# Patient Record
Sex: Male | Born: 1960
Health system: Southern US, Community
[De-identification: ages and names within clinical notes are randomized; demographics above are authoritative.]

## PROBLEM LIST (undated history)

## (undated) DIAGNOSIS — R569 Unspecified convulsions: Secondary | ICD-10-CM

## (undated) DIAGNOSIS — F79 Unspecified intellectual disabilities: Secondary | ICD-10-CM

---

## 2015-08-15 ENCOUNTER — Emergency Department (HOSPITAL_COMMUNITY)
Admission: EM | Admit: 2015-08-15 | Discharge: 2015-08-15 | Disposition: A | Discharge status: Home / Self Care | Payer: Medicare Other | Attending: Emergency Medicine | Admitting: Emergency Medicine

## 2015-08-15 ENCOUNTER — Encounter (HOSPITAL_COMMUNITY): Payer: Self-pay | Admitting: *Deleted

## 2015-08-15 DIAGNOSIS — Z8659 Personal history of other mental and behavioral disorders: Secondary | ICD-10-CM | POA: Diagnosis not present

## 2015-08-15 DIAGNOSIS — R569 Unspecified convulsions: Secondary | ICD-10-CM

## 2015-08-15 HISTORY — DX: Unspecified convulsions: R56.9

## 2015-08-15 HISTORY — DX: Unspecified intellectual disabilities: F79

## 2015-08-15 LAB — CBC WITH DIFFERENTIAL/PLATELET
BASOS ABS: 0 10*3/uL (ref 0.0–0.1)
Basophils Relative: 1 %
Eosinophils Absolute: 0.1 10*3/uL (ref 0.0–0.7)
Eosinophils Relative: 2 %
HCT: 41.9 % (ref 39.0–52.0)
HEMOGLOBIN: 13.8 g/dL (ref 13.0–17.0)
LYMPHS ABS: 1.3 10*3/uL (ref 0.7–4.0)
LYMPHS PCT: 27 %
MCH: 28.1 pg (ref 26.0–34.0)
MCHC: 32.9 g/dL (ref 30.0–36.0)
MCV: 85.3 fL (ref 78.0–100.0)
Monocytes Absolute: 0.1 10*3/uL (ref 0.1–1.0)
Monocytes Relative: 3 %
NEUTROS PCT: 67 %
Neutro Abs: 3.1 10*3/uL (ref 1.7–7.7)
Platelets: 180 10*3/uL (ref 150–400)
RBC: 4.91 MIL/uL (ref 4.22–5.81)
RDW: 12.5 % (ref 11.5–15.5)
WBC: 4.7 10*3/uL (ref 4.0–10.5)

## 2015-08-15 LAB — COMPREHENSIVE METABOLIC PANEL
ALT: 21 U/L (ref 17–63)
ANION GAP: 11 (ref 5–15)
AST: 25 U/L (ref 15–41)
Albumin: 4.2 g/dL (ref 3.5–5.0)
Alkaline Phosphatase: 50 U/L (ref 38–126)
BUN: 16 mg/dL (ref 6–20)
CHLORIDE: 102 mmol/L (ref 101–111)
CO2: 24 mmol/L (ref 22–32)
Calcium: 9.2 mg/dL (ref 8.9–10.3)
Creatinine, Ser: 0.91 mg/dL (ref 0.61–1.24)
Glucose, Bld: 87 mg/dL (ref 65–99)
POTASSIUM: 4 mmol/L (ref 3.5–5.1)
Sodium: 137 mmol/L (ref 135–145)
TOTAL PROTEIN: 7 g/dL (ref 6.5–8.1)
Total Bilirubin: 1 mg/dL (ref 0.3–1.2)

## 2015-08-15 LAB — RAPID URINE DRUG SCREEN, HOSP PERFORMED
AMPHETAMINES: NOT DETECTED
BARBITURATES: NOT DETECTED
BENZODIAZEPINES: NOT DETECTED
COCAINE: NOT DETECTED
Opiates: NOT DETECTED
TETRAHYDROCANNABINOL: NOT DETECTED

## 2015-08-15 LAB — URINALYSIS, ROUTINE W REFLEX MICROSCOPIC
BILIRUBIN URINE: NEGATIVE
Glucose, UA: NEGATIVE mg/dL
Ketones, ur: 15 mg/dL — AB
LEUKOCYTES UA: NEGATIVE
NITRITE: NEGATIVE
PH: 5.5 (ref 5.0–8.0)
Protein, ur: 100 mg/dL — AB
SPECIFIC GRAVITY, URINE: 1.025 (ref 1.005–1.030)

## 2015-08-15 LAB — URINE MICROSCOPIC-ADD ON

## 2015-08-15 MED ORDER — PHENYTOIN SODIUM EXTENDED 200 MG PO CAPS: 200.0000 mg | ORAL_CAPSULE | Freq: Two times a day (BID) | ORAL | Status: DC

## 2015-08-15 MED ORDER — PHENYTOIN SODIUM EXTENDED 100 MG PO CAPS
100.0000 mg | ORAL_CAPSULE | Freq: Once | ORAL | Status: AC
  Administered 2015-08-15: 100 mg via ORAL
  Filled 2015-08-15: qty 1

## 2015-08-15 NOTE — ED Provider Notes (Signed)
CSN: [id number]     Arrival date & time 08/15/15  1233 History   First MD Initiated Contact with Patient 08/15/15 1315     Chief Complaint  Patient presents with  . Seizures     (Consider location/radiation/quality/duration/timing/severity/associated sxs/prior Treatment) HPI   Charles Rich is a 55 y.o. male, with a history of seizures, presenting to the ED with reported seizure that occurred just prior to arrival. Patient is accompanied at the bedside by his family. Family provides a large part of the history. Family states that the patient was lying in bed, when he began to have a full body seizure that lasted 3-4 minutes. Patient was incontinent of urine and was postictal for about 15-20 minutes. Family adds that the patient was previously on Dilantin 200 mg twice a day, but was taken off of it by Dr. Bruna Potter about a year ago due to lack of seizure activity. About a month ago, patient began having seizures again. Patient has had about 3 seizures in the last 30 days. Patient denies any pain, falls, trauma, recent illness, fever/chills, nausea/vomiting, or any other complaints.    Past Medical History  Diagnosis Date  . Seizures (HCC)   . Mental retardation    No past surgical history on file. No family history on file. Social History  Substance Use Topics  . Smoking status: None  . Smokeless tobacco: None  . Alcohol Use: None    Review of Systems  Constitutional: Negative for fever and chills.  Gastrointestinal: Negative for nausea and vomiting.  Musculoskeletal: Negative for back pain and neck pain.  Neurological: Positive for seizures.  All other systems reviewed and are negative.     Allergies  Review of patient's allergies indicates no known allergies.  Home Medications   Prior to Admission medications   Medication Sig Start Date End Date Taking? Authorizing Provider  lactase (LACTAID) 3000 units tablet Take 3,000 Units by mouth daily as needed (dairy  products).   Yes Historical Provider, MD  phenytoin (DILANTIN) 200 MG ER capsule Take 1 capsule (200 mg total) by mouth 2 (two) times daily. 08/15/15   Shawn C Joy, PA-C   BP 115/77 mmHg  Pulse 80  Temp(Src) 97.5 F (36.4 C) (Oral)  Resp 16  SpO2 97% Physical Exam  Constitutional: He appears well-developed and well-nourished. No distress.  HENT:  Head: Normocephalic and atraumatic.  Eyes: Conjunctivae and EOM are normal. Pupils are equal, round, and reactive to light.  Neck: Normal range of motion. Neck supple.  Cardiovascular: Normal rate, regular rhythm, normal heart sounds and intact distal pulses.   Pulmonary/Chest: Effort normal and breath sounds normal. No respiratory distress.  Abdominal: Soft. There is no tenderness. There is no guarding.  Musculoskeletal: He exhibits no edema or tenderness.  Full ROM in all extremities and spine. No paraspinal tenderness.   Lymphadenopathy:    He has no cervical adenopathy.  Neurological: He is alert. He has normal reflexes.  Patient is oriented to his normal level, per family. This includes being able to answer with his name, day of the week, and the hospital.  Skin: Skin is warm and dry. He is not diaphoretic.  Psychiatric: He has a normal mood and affect. His behavior is normal.  Nursing note and vitals reviewed.   ED Course  Procedures (including critical care time) Labs Review Labs Reviewed  URINALYSIS, ROUTINE W REFLEX MICROSCOPIC (NOT AT [facility]) - Abnormal; Notable for the following:    Hgb urine dipstick LARGE (*)  Ketones, ur 15 (*)    Protein, ur 100 (*)    All other components within normal limits  URINE MICROSCOPIC-ADD ON - Abnormal; Notable for the following:    Squamous Epithelial / LPF 0-5 (*)    Bacteria, UA MANY (*)    All other components within normal limits  URINE CULTURE  URINE RAPID DRUG SCREEN, HOSP PERFORMED  COMPREHENSIVE METABOLIC PANEL  CBC WITH DIFFERENTIAL/PLATELET    Imaging Review No results  found. I have personally reviewed and evaluated these lab results as part of my medical decision-making.   EKG Interpretation None      MDM   Final diagnoses:  Seizure [facility](HCC)    Harvie BridgeJoseph Anthony Saxby presents with a seizure that occurred just prior to arrival.  Patient has a history of seizures and I suspect will just need to be put back on his seizure medication. Patient has an appointment for reevaluation by a P[facility] on May 19 at 9 AM. Patient had no abnormalities on his physical exam. The [facility] was contacted to see if there was an earlier appointment available, however, the May 19 appointment is the first one that would work with the patient's family's schedule. Patient was placed back on his Dilantin. UA shows possible UTI, however patient denies any urinary discomfort.  Urine was sent for culture. Discussed discharge and return precautions with the patient and his family. All parties voiced understanding of the instructions given, agree with the plan, and are comfortable with discharge.   Filed Vitals:   08/15/15 1430 08/15/15 1445 08/15/15 1454 08/15/15 1500  BP: 115/77 122/83 122/83 115/77  Pulse: 77 90 84 80  Temp:      TempSrc:      Resp: 15 13 16 16   SpO2: 98% 100% 100% 97%       Anselm PancoastShawn C Joy, PA-C 08/15/15 1524  Gerhard Munchobert Lockwood, MD 08/16/15 1557

## 2015-08-15 NOTE — ED Notes (Signed)
Per pt. Family, pt. Ran out of sz meds 30 days ago not 1 year ago. Family states pt. PCP passed away and has not found new dr to prescribe meds.

## 2015-08-15 NOTE — ED Notes (Signed)
[facility] and report is patient had a witnessed seizure that lasted 3 minutes and it was generalized per family.  Pt has history of seizures and was taken off Dilantin 1 year ago and since has had per family 3-4 focal like seizures.  Pt is his normal baseline where he knows his name, day of the week and hospital.

## 2015-08-15 NOTE — Discharge Instructions (Signed)
You have been seen today for seizures. Your lab tests showed no abnormalities. [facility] your [facility]. Begin taking your Dilantin tomorrow morning. You have been given your dose for this evening. Return to ED should seizures recur.

## 2015-08-15 NOTE — ED Notes (Signed)
Pt family reports that he has been off his seizure medication for 5 months. Pt has been taking this medication since birth but it was stopped by his PCP.

## 2015-08-16 LAB — URINE CULTURE

## 2015-09-01 ENCOUNTER — Ambulatory Visit: Payer: Self-pay | Admitting: Family Medicine

## 2015-09-08 ENCOUNTER — Ambulatory Visit: Payer: Self-pay | Admitting: Family Medicine

## 2015-11-02 ENCOUNTER — Ambulatory Visit (INDEPENDENT_AMBULATORY_CARE_PROVIDER_SITE_OTHER): Payer: Medicare Other | Admitting: Family Medicine

## 2015-11-02 ENCOUNTER — Encounter: Payer: Self-pay | Admitting: Family Medicine

## 2015-11-02 VITALS — BP 112/73 | HR 88 | Temp 98.4°F | Resp 16

## 2015-11-02 DIAGNOSIS — G40909 Epilepsy, unspecified, not intractable, without status epilepticus: Secondary | ICD-10-CM

## 2015-11-02 DIAGNOSIS — R531 Weakness: Secondary | ICD-10-CM

## 2015-11-02 DIAGNOSIS — Z13220 Encounter for screening for lipoid disorders: Secondary | ICD-10-CM

## 2015-11-02 LAB — CBC WITH DIFFERENTIAL/PLATELET
BASOS PCT: 1 %
Basophils Absolute: 51 cells/uL (ref 0–200)
EOS ABS: 153 {cells}/uL (ref 15–500)
Eosinophils Relative: 3 %
HEMATOCRIT: 38.3 % — AB (ref 38.5–50.0)
HEMOGLOBIN: 12.9 g/dL — AB (ref 13.2–17.1)
LYMPHS ABS: 1683 {cells}/uL (ref 850–3900)
LYMPHS PCT: 33 %
MCH: 28.5 pg (ref 27.0–33.0)
MCHC: 33.7 g/dL (ref 32.0–36.0)
MCV: 84.5 fL (ref 80.0–100.0)
MONO ABS: 306 {cells}/uL (ref 200–950)
MPV: 9.7 fL (ref 7.5–12.5)
Monocytes Relative: 6 %
Neutro Abs: 2907 cells/uL (ref 1500–7800)
Neutrophils Relative %: 57 %
Platelets: 171 10*3/uL (ref 140–400)
RBC: 4.53 MIL/uL (ref 4.20–5.80)
RDW: 13.3 % (ref 11.0–15.0)
WBC: 5.1 10*3/uL (ref 3.8–10.8)

## 2015-11-02 MED ORDER — PHENYTOIN SODIUM EXTENDED 200 MG PO CAPS: 200.0000 mg | ORAL_CAPSULE | Freq: Two times a day (BID) | ORAL | Status: DC

## 2015-11-02 NOTE — Progress Notes (Signed)
Patient ID: Charles Rich, male   DOB: [birth year], 55 y.o.   MRN: [id number]   Charles Rich, is a 55 y.o. male  WUJ:[id number]  [id number]  DOB - [birth year]  CC:  Chief Complaint  Patient presents with  . Establish Care  . Medication Refill  . Seizures       HPI: Charles Rich is a 55 y.o. male here to establish care. He is accompanied by family member and care giver. He has a history of mental retardation and most information is provided by family. He had a history of seizure disorder. His Dilantin was discontinued approximately a year ago due to no recent seizure activity. He has had several seizures over the last few weeks and was recently seen in ED and restarted on Dilantin 200 daily on 4-25. He has had no seizure activity since restarting. He was instructed to follow-up here for ongoing care. His only other medication is Lactaid. Family reports he is unable to walk and he ambulated by crawling. He is in a wheelchair. Home PT is requested to help with strength building in his legs.  He does need a refill on Dilantin.  No Known Allergies Past Medical History  Diagnosis Date  . Seizures (HCC)   . Mental retardation    Current Outpatient Prescriptions on File Prior to Visit  Medication Sig Dispense Refill  . lactase (LACTAID) 3000 units tablet Take 3,000 Units by mouth daily as needed (dairy products).     No current facility-administered medications on file prior to visit.   Family History  Problem Relation Age of Onset  . Diabetes Mother    Social History   Social History  . Marital Status: Single    Spouse Name: N/A  . Number of Children: N/A  . Years of Education: N/A   Occupational History  . Not on file.   Social History Main Topics  . Smoking status: Never Smoker   . Smokeless tobacco: Not on file  . Alcohol Use: No  . Drug Use: No  . Sexual Activity: Not on file   Other Topics Concern  . Not on file   Social History Narrative    Review  of Systems: Constitutional: Negative for fever, chills, appetite change, weight loss,  Fatigue. Skin: Negative for rashes or lesions of concern. HENT: Negative for ear pain, ear discharge.nose bleeds Eyes: Negative for pain, discharge, redness, itching and visual disturbance. Neck: Negative for pain, stiffness Respiratory: Negative for cough, shortness of breath,   Cardiovascular: Negative for chest pain, palpitations and leg swelling. Gastrointestinal: Negative for abdominal pain, nausea, vomiting, constipations. Positive for diarrhea Genitourinary: Negative for dysuria, urgency, frequency, hematuria,  Musculoskeletal: Negative for back pain, joint pain, joint  swelling, and gait problem Positive for weakness of legs Neurological: Negative for dizziness, tremors, syncope,   light-headedness, numbness and headaches. Positive for seizure disorder Hematological: Negative for easy bruising or bleeding Psychiatric/Behavioral: Negative for depression, anxiety, decreased concentration, confusion   Objective:   Filed Vitals:   11/02/15 0924  BP: 112/73  Pulse: 88  Temp: 98.4 F (36.9 C)  Resp: 16    Physical Exam: Constitutional: Patient is very thin. Appears in no distress. HENT: Normocephalic, atraumatic, External right and left ear normal. Oropharynx is clear and moist.  Eyes: Conjunctivae and EOM are normal. PERRLA, no scleral icterus. Neck: Normal ROM. Neck supple. No lymphadenopathy, No thyromegaly. CVS: RRR, S1/S2 +, no murmurs, no gallops, no rubs Pulmonary: Effort and breath sounds normal, no stridor, rhonchi, wheezes,  rales.  Abdominal: Soft. Normoactive BS,, no distension, tenderness, rebound or guarding.  Musculoskeletal: Normal range of motion. No edema and no tenderness.  Further exam deferred will have assessed by home PT. Neuro: Alert.Normal muscle tone coordination. Non-focal Skin: Skin is warm and dry. No rash noted. Not diaphoretic. No erythema. No  pallor. Psychiatric: Family reports he is in his usual mental and emotional state.  Lab Results  Component Value Date   WBC 4.7 08/15/2015   HGB 13.8 08/15/2015   HCT 41.9 08/15/2015   MCV 85.3 08/15/2015   PLT 180 08/15/2015   Lab Results  Component Value Date   CREATININE 0.91 08/15/2015   BUN 16 08/15/2015   NA 137 08/15/2015   K 4.0 08/15/2015   CL 102 08/15/2015   CO2 24 08/15/2015    No results found for: HGBA1C Lipid Panel  No results found for: CHOL, TRIG, HDL, CHOLHDL, VLDL, LDLCALC     Assessment and plan:   1. Seizure disorder (HCC)  - Phenytoin level, free and total - COMPLETE METABOLIC PANEL WITH GFR - CBC with Differential - phenytoin (DILANTIN) 200 MG ER capsule; Take 1 capsule (200 mg total) by mouth 2 (two) times daily.  Dispense: 60 capsule; Refill: 2  2. Screening for cholesterol level    - Lipid panel   No Follow-up on file.  The patient was given clear instructions to go to ER or return to medical center if symptoms don't improve, worsen or new problems develop. The patient verbalized understanding.    Henrietta HooverLinda C Bernhardt FNP  11/02/2015, 11:52 AM

## 2015-11-03 LAB — COMPLETE METABOLIC PANEL WITH GFR
ALT: 17 U/L (ref 9–46)
AST: 22 U/L (ref 10–35)
Albumin: 4.4 g/dL (ref 3.6–5.1)
Alkaline Phosphatase: 64 U/L (ref 40–115)
BUN: 19 mg/dL (ref 7–25)
CALCIUM: 8.9 mg/dL (ref 8.6–10.3)
CHLORIDE: 101 mmol/L (ref 98–110)
CO2: 25 mmol/L (ref 20–31)
CREATININE: 0.82 mg/dL (ref 0.70–1.33)
GFR, Est Non African American: >89 mL/min (ref >=60)
Glucose, Bld: 89 mg/dL (ref 65–99)
POTASSIUM: 4.1 mmol/L (ref 3.5–5.3)
Sodium: 137 mmol/L (ref 135–146)
Total Bilirubin: 0.4 mg/dL (ref 0.2–1.2)
Total Protein: 6.6 g/dL (ref 6.1–8.1)

## 2015-11-03 LAB — LIPID PANEL
CHOL/HDL RATIO: 3.6 ratio (ref <=5.0)
Cholesterol: 204 mg/dL — ABNORMAL HIGH (ref 125–200)
HDL: 57 mg/dL (ref >=40)
LDL CALC: 134 mg/dL — AB (ref <130)
TRIGLYCERIDES: 64 mg/dL (ref <150)
VLDL: 13 mg/dL (ref <30)

## 2015-11-04 LAB — PHENYTOIN LEVEL, FREE AND TOTAL

## 2015-11-06 ENCOUNTER — Telehealth: Payer: Self-pay

## 2015-11-06 NOTE — Telephone Encounter (Signed)
-----   Message from Henrietta HooverLinda C Bernhardt, NP sent at 11/06/2015  7:50 AM EDT ----- Dilantin not at therapeutic level. Confirm that he is taking regularly.

## 2015-11-06 NOTE — Telephone Encounter (Signed)
Called, no answer. Left message to return call. Thanks!  

## 2015-11-07 ENCOUNTER — Other Ambulatory Visit: Payer: Self-pay | Admitting: Family Medicine

## 2015-12-28 ENCOUNTER — Telehealth: Payer: Self-pay

## 2015-12-28 MED ORDER — LACTASE 3000 UNITS PO TABS: 3000.0000 [IU] | ORAL_TABLET | Freq: Every day | ORAL | 3 refills | Status: DC | PRN

## 2015-12-28 NOTE — Telephone Encounter (Signed)
Refill sent into pharmacy. Thanks!  

## 2016-02-12 ENCOUNTER — Ambulatory Visit: Payer: Medicare Other | Admitting: Family Medicine

## 2017-03-25 ENCOUNTER — Ambulatory Visit: Payer: Medicare Other | Admitting: Family Medicine

## 2017-04-08 ENCOUNTER — Ambulatory Visit: Payer: Medicare Other | Admitting: Family Medicine

## 2017-04-29 ENCOUNTER — Ambulatory Visit: Payer: Medicare Other | Admitting: Family Medicine

## 2017-05-02 ENCOUNTER — Ambulatory Visit (INDEPENDENT_AMBULATORY_CARE_PROVIDER_SITE_OTHER): Payer: Medicare Other | Admitting: Family Medicine

## 2017-05-02 ENCOUNTER — Encounter: Payer: Self-pay | Admitting: Family Medicine

## 2017-05-02 VITALS — BP 124/88 | HR 84 | Temp 98.8°F | Ht 72.0 in

## 2017-05-02 DIAGNOSIS — Z23 Encounter for immunization: Secondary | ICD-10-CM

## 2017-05-02 DIAGNOSIS — R569 Unspecified convulsions: Secondary | ICD-10-CM | POA: Diagnosis not present

## 2017-05-02 DIAGNOSIS — Z1329 Encounter for screening for other suspected endocrine disorder: Secondary | ICD-10-CM | POA: Diagnosis not present

## 2017-05-02 DIAGNOSIS — Z131 Encounter for screening for diabetes mellitus: Secondary | ICD-10-CM | POA: Diagnosis not present

## 2017-05-02 DIAGNOSIS — Z7409 Other reduced mobility: Secondary | ICD-10-CM

## 2017-05-02 DIAGNOSIS — F79 Unspecified intellectual disabilities: Secondary | ICD-10-CM | POA: Diagnosis not present

## 2017-05-02 LAB — POCT GLYCOSYLATED HEMOGLOBIN (HGB A1C): HEMOGLOBIN A1C: 6

## 2017-05-02 MED ORDER — GABAPENTIN 300 MG PO CAPS: 300.0000 mg | ORAL_CAPSULE | Freq: Three times a day (TID) | ORAL | 1 refills | Status: DC

## 2017-05-02 NOTE — Patient Instructions (Addendum)
I am referring you to neurology. [facility] will contact you to schedule appointment.  For now, I would like you to start Gabapentin 300 mg, 3 times daily for seizure prevention.   I will place [facility] in home-attendant.   Seizure, Adult When you have a seizure:  Parts of your body may move.  How aware or awake (conscious) you are may change.  You may shake (convulse).  Some people have symptoms right before a seizure happens. These symptoms may include:  Fear.  Worry (anxiety).  Feeling like you are going to throw up (nausea).  Feeling like the room is spinning (vertigo).  Feeling like you saw or heard something before (deja vu).  Odd tastes or smells.  Changes in vision, such as seeing flashing lights or spots.  Seizures usually last from 30 seconds to 2 minutes. Usually, they are not harmful unless they last a long time. Follow these instructions at home: Medicines  Take over-the-counter and prescription medicines only as told by your doctor.  Avoid anything that may keep your medicine from working, such as alcohol. Activity  Do not do any activities that would be dangerous if you had another seizure, like driving or swimming. Wait until your doctor approves.  If you live in the [place] ask your local DMV (department of motor vehicles) when you can drive.  Rest. Teaching others  Teach friends and family what to do when you have a seizure. They should: ? Lay you on the ground. ? Protect your head and body. ? Loosen any tight clothing around your neck. ? Turn you on your side. ? Stay with you until you are better. ? Not hold you down. ? Not put anything in your mouth. ? Know whether or not you need emergency care. General instructions  Contact your doctor each time you have a seizure.  Avoid anything that gives you seizures.  Keep a seizure diary. Write down: ? What you think caused each seizure. ? What  you remember about each seizure.  Keep all follow-up visits as told by your doctor. This is important. Contact a doctor if:  You have another seizure.  You have seizures more often.  There is any change in what happens during your seizures.  You continue to have seizures with treatment.  You have symptoms of being sick or having an infection. Get help right away if:  You have a seizure: ? That lasts longer than 5 minutes. ? That is different than seizures you had before. ? That makes it harder to breathe. ? After you hurt your head.  After a seizure, you cannot speak or use a part of your body.  After a seizure, you are confused or have a bad headache.  You have two or more seizures in a row.  You are having seizures more often.  You do not wake up right after a seizure.  You get hurt during a seizure. In an emergency:  These symptoms may be an emergency. Do not wait to see if the symptoms will go away. Get medical help right away. Call your local emergency services (911 in the [place] Do not drive yourself to the hospital. This information is not intended to replace advice given to you by your health care provider. Make sure you discuss any questions you have with your health care provider. Document Released: 09/25/2007 Document Revised: 12/20/2015 Document Reviewed: 12/20/2015 Elsevier Interactive Patient Education  2017 [employer]

## 2017-05-02 NOTE — Progress Notes (Signed)
Patient ID: Charles Rich, male    DOB: 03/12/1961, 57 y.o.   MRN: [id number]  PCP: Bing NeighborsHarris, Kavion Mancinas S, FNP  Chief Complaint  Patient presents with  . Establish Care  . Medication Refill  . Referral    Home health    Subjective:  HPI Charles BridgeJoseph Anthony Cheema is a 57 y.o. male with history of seizure disorder, mental retardation, presents to establish care and referral requests. He is accompanied by caregiver/family member with very limited medical history related to patient. Caregiver initially asked for medication refills of Dilantin and Lactase as patient has been taking medications. According to Epic medication was last refilled 01/07/2016 with 30 days supply with 3 refills. Once presented with this information, the caregiver reported that he hasn't had antiseizure medication in greater than 1 year.Reports weekly seizure activity, with the last seizure occurring over 1 week ago in which he did not loose conscious. She reports to her knowledge initial seizure occurred in 2017. He has lived most of his life and received most of his healthcare from [place] although doesn't recall the healthcare facility in which he received care or all of the conditions in which he suffers from. He is immobile, although walked prior to 2006. Uncertain of CVA history. Incontinent of stool and urine. He responds "yes" to all questions. He has not been to the emergency department or hospitalized for at least the last two years. Unknown prior 2 years.Caregiver is requesting a home health attendant to assist with bathing, dressing, and preparing meals for patient.  Social History   Socioeconomic History  . Marital status: Single    Spouse name: Not on file  . Number of children: Not on file  . Years of education: Not on file  . Highest education level: Not on file  Social Needs  . Financial resource strain: Not on file  . Food insecurity - worry: Not on file  . Food insecurity - inability: Not on file  .  Transportation needs - medical: Not on file  . Transportation needs - non-medical: Not on file  Occupational History  . Not on file  Tobacco Use  . Smoking status: Never Smoker  . Smokeless tobacco: Never Used  Substance and Sexual Activity  . Alcohol use: No  . Drug use: No  . Sexual activity: Not on file  Other Topics Concern  . Not on file  Social History Narrative  . Not on file    Family History  Problem Relation Age of Onset  . Diabetes Mother      Review of Systems  There are no active problems to display for this patient.   No Known Allergies  Prior to Admission medications   Medication Sig Start Date End Date Taking? Authorizing Provider  lactase (LACTAID) 3000 units tablet Take 1 tablet (3,000 Units total) by mouth daily as needed (dairy products). 12/28/15  Yes Henrietta HooverBernhardt, Linda C, NP  phenytoin (DILANTIN) 200 MG ER capsule Take 1 capsule (200 mg total) by mouth 2 (two) times daily. 11/02/15  Yes Henrietta HooverBernhardt, Linda C, NP    Past Medical, Surgical Family and Social History reviewed and updated.    Objective:   Today's Vitals   05/02/17 1420  BP: 124/88  Pulse: 84  Temp: 98.8 F (37.1 C)  TempSrc: Oral  SpO2: 100%  Height: 6' (1.829 m)    Wt Readings from Last 3 Encounters:  No data found for [employer]    Physical Exam  Constitutional: He is cooperative.  HENT:  Head: Normocephalic.  Mouth/Throat: Oropharynx is clear and moist.  Eyes: Conjunctivae are normal. Pupils are equal, round, and reactive to light.  Neck: Normal range of motion. Neck supple.  Cardiovascular: Normal rate, regular rhythm, normal heart sounds and intact distal pulses.  Pulmonary/Chest: Effort normal and breath sounds normal.  Abdominal: Soft. Bowel sounds are normal.  Neurological: He is alert.  Oriented to self  Followed most commands  Skin: Skin is warm and dry.  Psychiatric: His behavior is normal. Cognition and memory are impaired.  Happy, pleasant, mood limited  vocabulary  although words intelligible     Assessment & Plan:  1. Seizures (HCC), unspecified severity. No prior medical records available.  According to the EMR patient has not had any previous EEGs or neurological evaluation.   he has been off of previously prescribed antiepileptic medication since the end of 2017.   Plan: Opted not to resume Dilantin as I am unaware of the patient's underlying condition and have very limited medical history of the patient.  I will place on gabapentin 300 mg 3 times daily to control reported seizure activity.  I am referring patient emergently to neurology for further evaluation and follow-up.  2. Immobility, unknown cause.  Duration of immobility has been somewhere around 14 years.  No known history of a stroke no known history of an injury.  Patient is unable to maintain balance. Plan-Placing an order for home physical therapy after patient is evaluated by neurology. Complete a referral for in home personal care attendant   3. Mental retardation, unknown cause or diagnosis  -neurology referral pending   4. Prediabetes - Hb A1C-6.0 Encouraged increase vegetables, lean meats, and limit breads/startch foods   5. Need for immunization against influenza- Flu Vaccine QUAD 36+ mos IM  6. Screening for thyroid disorder- Thyroid Panel With TSH   Meds ordered this encounter  Medications  . gabapentin (NEURONTIN) 300 MG capsule    Sig: Take 1 capsule (300 mg total) by mouth 3 (three) times daily.    Dispense:  90 capsule    Refill:  1    Order Specific Question:   Supervising Provider    Answer:   Quentin Angst [[id number]]    A total of 45 minutes spent, greater than 50 % of this time was spent reviewing prior medical history, reviewing medications and indications of treatment, prior labs and diagnostic tests, discussing current plan of treatment, health promotion, and goals of treatment.    RTC: 3 months, follow-up on referrals and wellness visit   Godfrey Pick. Tiburcio Pea, MSN, FNP-C [facility]  [address] [place], [place] [address] [phone]

## 2017-05-03 LAB — CBC WITH DIFFERENTIAL/PLATELET
BASOS ABS: 0 10*3/uL (ref 0.0–0.2)
Basos: 1 %
EOS (ABSOLUTE): 0.1 10*3/uL (ref 0.0–0.4)
Eos: 1 %
HEMOGLOBIN: 13.9 g/dL (ref 13.0–17.7)
Hematocrit: 42 % (ref 37.5–51.0)
Immature Grans (Abs): 0 10*3/uL (ref 0.0–0.1)
Immature Granulocytes: 0 %
LYMPHS ABS: 1.6 10*3/uL (ref 0.7–3.1)
Lymphs: 21 %
MCH: 28.3 pg (ref 26.6–33.0)
MCHC: 33.1 g/dL (ref 31.5–35.7)
MCV: 85 fL (ref 79–97)
MONOCYTES: 6 %
MONOS ABS: 0.4 10*3/uL (ref 0.1–0.9)
NEUTROS ABS: 5.4 10*3/uL (ref 1.4–7.0)
Neutrophils: 71 %
Platelets: 201 10*3/uL (ref 150–379)
RBC: 4.92 x10E6/uL (ref 4.14–5.80)
RDW: 12.9 % (ref 12.3–15.4)
WBC: 7.6 10*3/uL (ref 3.4–10.8)

## 2017-05-03 LAB — COMPREHENSIVE METABOLIC PANEL
A/G RATIO: 1.6 (ref 1.2–2.2)
ALBUMIN: 4.1 g/dL (ref 3.5–5.5)
ALT: 17 IU/L (ref 0–44)
AST: 16 IU/L (ref 0–40)
Alkaline Phosphatase: 52 IU/L (ref 39–117)
BILIRUBIN TOTAL: 0.3 mg/dL (ref 0.0–1.2)
BUN / CREAT RATIO: 18 (ref 9–20)
BUN: 13 mg/dL (ref 6–24)
CHLORIDE: 102 mmol/L (ref 96–106)
CO2: 24 mmol/L (ref 20–29)
Calcium: 9.3 mg/dL (ref 8.7–10.2)
Creatinine, Ser: 0.73 mg/dL — ABNORMAL LOW (ref 0.76–1.27)
GFR calc non Af Amer: 104 mL/min/{1.73_m2} (ref >59)
GFR, EST AFRICAN AMERICAN: 120 mL/min/{1.73_m2} (ref >59)
Globulin, Total: 2.6 g/dL (ref 1.5–4.5)
Glucose: 85 mg/dL (ref 65–99)
POTASSIUM: 4.1 mmol/L (ref 3.5–5.2)
SODIUM: 139 mmol/L (ref 134–144)
TOTAL PROTEIN: 6.7 g/dL (ref 6.0–8.5)

## 2017-05-03 LAB — THYROID PANEL WITH TSH
Free Thyroxine Index: 2.5 (ref 1.2–4.9)
T3 Uptake Ratio: 27 % (ref 24–39)
T4, Total: 9.3 ug/dL (ref 4.5–12.0)
TSH: 1.57 u[IU]/mL (ref 0.450–4.500)

## 2017-07-03 ENCOUNTER — Encounter: Payer: Self-pay | Admitting: Neurology

## 2017-07-03 ENCOUNTER — Telehealth: Payer: Self-pay | Admitting: Neurology

## 2017-07-03 ENCOUNTER — Ambulatory Visit (INDEPENDENT_AMBULATORY_CARE_PROVIDER_SITE_OTHER): Payer: Medicare Other | Admitting: Neurology

## 2017-07-03 ENCOUNTER — Encounter (INDEPENDENT_AMBULATORY_CARE_PROVIDER_SITE_OTHER): Payer: Self-pay

## 2017-07-03 VITALS — BP 154/93 | HR 103

## 2017-07-03 DIAGNOSIS — G40919 Epilepsy, unspecified, intractable, without status epilepticus: Secondary | ICD-10-CM | POA: Diagnosis not present

## 2017-07-03 MED ORDER — LEVETIRACETAM 500 MG PO TABS: 500.0000 mg | ORAL_TABLET | Freq: Two times a day (BID) | ORAL | 11 refills | Status: DC

## 2017-07-03 NOTE — Telephone Encounter (Signed)
[employer] Medicare/medicaid order sent to GI they will reach out to the patient to schedule.

## 2017-07-03 NOTE — Progress Notes (Signed)
PATIENT: Charles Rich DOB: 01/30/1961  Chief Complaint  Patient presents with  . Seizures    His nephew, Charles Rich, brought him to the appt today.  He is unsure of patient'Rich medical history.  The patient'Rich brother, Charles Rich, cares for him.  I called his brother at home and he confirmed that he is only taking gabapentin 300mg , one capsule TID and a daily multivitamin.  States the patient has not had a seizure in many years.  . New Patient (Initial Visit)    PCP: Charles CourtsKimberly Harris, NP     HISTORICAL  Charles Rich is a 57 year old male, is accompanied by his nephew Charles Rich and his niece Charles Rich for evaluation of seizure, seen in refer by his primary care nurse practitioner Charles Rich, initial evaluation was on July 03, 2017.  He has a long history of epilepsy, was born with mental retardation, used to live with his mother, since his mother passed away in 2006, he moved in with his brother since 2008, was previously under the care of his primary care physician  He used to take Dilantin, was recently put on gabapentin 300 mg 3 times a day since his recent visit with his primary care physician January 2019, niece reported that patient no longer ambulatory, has regular eating, bowel bladder regimen, is usually happy.  He has spells of sudden body flexion, especially with startle, this can happen many episode a day, about couple times each months, he would have a generalized seizure,   There was limited to history, he had a history of seizure disorder, mental retardation, according to epic, he presented to the emergency room in 2017 for recurrent seizure, there was no significant emotional outburst,  REVIEW OF SYSTEMS: Full 14 system review of systems performed and notable only for as above.  ALLERGIES: No Known Allergies  HOME MEDICATIONS: Current Outpatient Medications  Medication Sig Dispense Refill  . Multiple Vitamin  (MULTIVITAMIN) capsule Take 1 capsule by mouth daily.    Marland Kitchen. gabapentin (NEURONTIN) 300 MG capsule Take 1 capsule (300 mg total) by mouth 3 (three) times daily. 90 capsule 1   No current facility-administered medications for this visit.     PAST MEDICAL HISTORY: Past Medical History:  Diagnosis Date  . Mental retardation   . Seizures (HCC)     PAST SURGICAL HISTORY: History reviewed. No pertinent surgical history.  FAMILY HISTORY: Family History  Problem Relation Age of Onset  . Diabetes Mother     SOCIAL HISTORY:  Social History   Socioeconomic History  . Marital status: Single    Spouse name: Not on file  . Number of children: Not on file  . Years of education: Not on file  . Highest education level: Not on file  Social Needs  . Financial resource strain: Not on file  . Food insecurity - worry: Not on file  . Food insecurity - inability: Not on file  . Transportation needs - medical: Not on file  . Transportation needs - non-medical: Not on file  Occupational History  . Not on file  Tobacco Use  . Smoking status: Never Smoker  . Smokeless tobacco: Never Used  Substance and Sexual Activity  . Alcohol use: No  . Drug use: No  . Sexual activity: Not on file  Other Topics Concern  . Not on file  Social History Narrative  . Not on file     PHYSICAL EXAM   Vitals:   07/03/17 1439  BP: (!) 154/93  Pulse: (!) 103    Not recorded      There is no height or weight on file to calculate BMI.  PHYSICAL EXAMNIATION:  Gen: NAD, conversant, well nourised, obese, well groomed                     Cardiovascular: Regular rate rhythm, no peripheral edema, warm, nontender. Eyes: Conjunctivae clear without exudates or hemorrhage Neck: Supple, no carotid bruits. Pulmonary: Clear to auscultation bilaterally   NEUROLOGICAL EXAM:  MENTAL STATUS: Speech/cognition: Slurred speech, following commands, but was not able to provide any meaningful history  Cranial  nerve II through XII: Pupils are equal round reactive to light, extraocular muscle movement was normal,   facial were symmetric, tongue was in midline, head turning was normal and symmetric,      MOTOR: Able to move upper extremity without any difficulty, fixed bilateral knee flexion at 160 degrees,  REFLEXES: Reflexes are hypoactive and symmetric at the biceps, triceps, knees, and ankles. Plantar responses are flexor.  SENSORY: Withdraws to pain  COORDINATION: There was no limb dysmetria noticed,   GAIT/STANCE: Deferred   DIAGNOSTIC DATA (LABS, IMAGING, TESTING) - I reviewed patient records, labs, notes, testing and imaging myself where available.   ASSESSMENT AND PLAN  Charles Rich is a 57 y.o. male   Mental retardation Epilepsy  CT head without contrast  EEG  Stop gabapentin, start Keppra 500 mg twice a day   Charles Rich, M.D. Ph.D.  [facility] [address] [place], [place] [address] Ph: [phone] Fax: [phone]  Charles Rich

## 2017-07-15 ENCOUNTER — Other Ambulatory Visit: Payer: Medicare Other

## 2017-07-28 ENCOUNTER — Other Ambulatory Visit: Payer: Medicare Other

## 2017-08-01 ENCOUNTER — Ambulatory Visit: Payer: Medicare Other | Admitting: Family Medicine

## 2017-08-04 ENCOUNTER — Other Ambulatory Visit: Payer: Medicare Other

## 2017-08-06 ENCOUNTER — Encounter: Payer: Self-pay | Admitting: Neurology

## 2017-08-21 ENCOUNTER — Other Ambulatory Visit: Payer: Self-pay | Admitting: Family Medicine

## 2017-09-03 ENCOUNTER — Ambulatory Visit: Payer: Medicare Other | Admitting: Family Medicine

## 2017-09-30 ENCOUNTER — Ambulatory Visit (INDEPENDENT_AMBULATORY_CARE_PROVIDER_SITE_OTHER): Payer: Medicare Other | Admitting: Family Medicine

## 2017-09-30 ENCOUNTER — Encounter: Payer: Self-pay | Admitting: Family Medicine

## 2017-09-30 VITALS — BP 142/90 | HR 100 | Ht 72.0 in

## 2017-09-30 DIAGNOSIS — Z131 Encounter for screening for diabetes mellitus: Secondary | ICD-10-CM | POA: Diagnosis not present

## 2017-09-30 DIAGNOSIS — Z09 Encounter for follow-up examination after completed treatment for conditions other than malignant neoplasm: Secondary | ICD-10-CM

## 2017-09-30 DIAGNOSIS — S99922D Unspecified injury of left foot, subsequent encounter: Secondary | ICD-10-CM

## 2017-09-30 DIAGNOSIS — Z1329 Encounter for screening for other suspected endocrine disorder: Secondary | ICD-10-CM

## 2017-09-30 DIAGNOSIS — Z13220 Encounter for screening for lipoid disorders: Secondary | ICD-10-CM | POA: Diagnosis not present

## 2017-09-30 DIAGNOSIS — G40909 Epilepsy, unspecified, not intractable, without status epilepticus: Secondary | ICD-10-CM | POA: Diagnosis not present

## 2017-09-30 LAB — POCT GLYCOSYLATED HEMOGLOBIN (HGB A1C): Hemoglobin A1C: 5.9 % — AB (ref 4.0–5.6)

## 2017-09-30 NOTE — Progress Notes (Addendum)
Subjective:    Patient ID: Charles Rich, male    DOB: [birth year], 57 y.o.   MRN: [id number]   PCP: Charles Ip, NP  Chief Complaint  Patient presents with  . Foot Pain    RIGHT     HPI  Charles Rich has a history of Seizures and Mental Retardation. He his her today for follow up.   Current Status: He has Mental Retardation. He is accompanied by his niece, who is also his Psychologist, counselling. She states that he has been doing well. He has a cut on his foot. He continues to point at it when mentioned, but he does not complain of pain. He will not allow anyont to examine his foot. The scar is healing. He denies fevers, chills, fatigue, weight loss, and night sweats. Denies visual changes, headaches, dizziness, and falls.   Niece has discontinued Home Health Services, because she will take over care.   He continues to take all prescribed medications.   He denies chest pain, shortness of breath, cough, and heart palpitations.   He has a good appetite. He denies abdominal pain, nausea, vomiting, diarrhea, and constipation. He denies any bleeding episodes. Denies hematuria, dysuria, and frequency.   He has a history of anxiety.   He denies pain .    Past Medical History:  Diagnosis Date  . Mental retardation   . Seizures (HCC)     Family History  Problem Relation Age of Onset  . Diabetes Mother     Social History   Socioeconomic History  . Marital status: Single    Spouse name: Not on file  . Number of children: Not on file  . Years of education: Not on file  . Highest education level: Not on file  Occupational History  . Not on file  Social Needs  . Financial resource strain: Not on file  . Food insecurity:    Worry: Not on file    Inability: Not on file  . Transportation needs:    Medical: Not on file    Non-medical: Not on file  Tobacco Use  . Smoking status: Never Smoker  . Smokeless tobacco: Never Used  Substance and Sexual Activity  .  Alcohol use: No  . Drug use: No  . Sexual activity: Not on file  Lifestyle  . Physical activity:    Days per week: Not on file    Minutes per session: Not on file  . Stress: Not on file  Relationships  . Social connections:    Talks on phone: Not on file    Gets together: Not on file    Attends religious service: Not on file    Active member of club or organization: Not on file    Attends meetings of clubs or organizations: Not on file    Relationship status: Not on file  . Intimate partner violence:    Fear of current or ex partner: Not on file    Emotionally abused: Not on file    Physically abused: Not on file    Forced sexual activity: Not on file  Other Topics Concern  . Not on file  Social History Narrative  . Not on file    History reviewed. No pertinent surgical history.   Immunization History  Administered Date(s) Administered  . Influenza,inj,Quad PF,6+ Mos 05/02/2017  . Tdap 05/02/2017    Current Meds  Medication Sig  . levETIRAcetam (KEPPRA) 500 MG tablet Take 1 tablet (500 mg total) by  mouth 2 (two) times daily.  . Multiple Vitamin (MULTIVITAMIN) capsule Take 1 capsule by mouth daily.    No Known Allergies  BP (!) 142/90 (BP Location: Left Arm, Patient Position: Sitting, Cuff Size: Large)   Pulse 100   Ht 6' (1.829 m)   SpO2 98%   Review of Systems  Constitutional: Negative.   HENT: Negative.   Cardiovascular: Negative.   Gastrointestinal: Negative.   Endocrine: Negative.   Genitourinary: Negative.   Musculoskeletal:       Left foot scar. Demonstrates pain.  Skin:       Foot scar.   Allergic/Immunologic: Negative.   Neurological: Negative.   Hematological: Negative.   Psychiatric/Behavioral: Positive for behavioral problems (Mental Retardation. ).       Mental Retardation      Objective:   Physical Exam  Constitutional: He appears well-developed and well-nourished.  HENT:  Head: Normocephalic and atraumatic.  Right Ear: External ear  normal.  Left Ear: External ear normal.  Nose: Nose normal.  Mouth/Throat: Oropharynx is clear and moist.  Eyes: Pupils are equal, round, and reactive to light. Conjunctivae and EOM are normal.  Neck: Normal range of motion. Neck supple.  Cardiovascular: Normal rate, regular rhythm and intact distal pulses.  Pulmonary/Chest: Effort normal and breath sounds normal.  Abdominal: Bowel sounds are normal.  Musculoskeletal: He exhibits tenderness (Left foot).  2 1/2 inch healing scar located on anterior Left foot. Demonstrates pain.   Feet:  Left Foot:  Skin Integrity: Positive for skin breakdown.  Neurological: He is alert.  Skin: Skin is warm. Capillary refill takes less than 2 seconds.     Psychiatric: He has a normal mood and affect. His behavior is normal. Judgment and thought content normal.  Nursing note and vitals reviewed.   Assessment & Plan:   1. Foot injury, left, subsequent encounter - Ambulatory referral to Podiatry  2. Seizure disorder (HCC) Stable. Continue Keppra as directed.  3. Screening for cholesterol level - Lipid Panel drawn today.   4. Screening for thyroid disorder - Thyroid.   5. Screening for diabetes mellitus - HgB A1c.  6. Follow up Follow up in 3 months.  No orders of the defined types were placed in this encounter.   Charles IpNatalie Sherylann Vangorden,  MSN, FNP-BC [facility] [address]  [place], [place] [id number] [phone]

## 2017-10-09 ENCOUNTER — Ambulatory Visit: Payer: Medicare Other | Admitting: Neurology

## 2017-10-15 ENCOUNTER — Ambulatory Visit: Payer: Medicare Other | Admitting: Podiatry

## 2017-11-05 ENCOUNTER — Other Ambulatory Visit: Payer: Self-pay | Admitting: Internal Medicine

## 2017-11-05 NOTE — Telephone Encounter (Signed)
Pt was last seen by PCP Bradly ChrisStroud 09/2017. Doesn't have another visit until 12/31/17. Last gabapentin rx sent 08/21/17 for #90 with 1 refill (2 month supply in total). Will forward information to PCP.

## 2017-12-01 ENCOUNTER — Telehealth: Payer: Self-pay | Admitting: *Deleted

## 2017-12-01 MED ORDER — LEVETIRACETAM 500 MG PO TABS: ORAL_TABLET | ORAL | 0 refills | Status: AC

## 2017-12-01 NOTE — Telephone Encounter (Signed)
Appt scheduled with Eber Jonesarolyn on 8/14.  FYI

## 2017-12-01 NOTE — Telephone Encounter (Signed)
Noted  

## 2017-12-01 NOTE — Telephone Encounter (Signed)
EMS called concerning this patient.  Reports he had a seizure at 10pm on 11/30/17. His niece called EMS to have him further evaluated.  Per EMS, he is stable with only fatigue and headache today. Last seizure many years ago.  He is currently taking Keppra 500mg  BID.  Per vo by Dr. Terrace ArabiaYan, increase Keppra to 500mg  in am and 1000mg  in pm.  New rx sent to pharmacy.  She would like the patient seen this week.  EMS will ask his niece to call our office to schedule this appt.  He can be seen by MD or NP.

## 2017-12-02 NOTE — Progress Notes (Deleted)
[facility]  PATIENT: Charles Rich DOB: 09/11/1960   REASON FOR VISIT: Follow-up for seizure disorder with recent seizure HISTORY FROM:    HISTORY OF PRESENT ILLNESS: 3/14/19YYJoseph Bjorn Losernthony Rich is a 57 year old male, is accompanied by his nephew Charles Rich and his niece Charles Rich for evaluation of seizure, seen in refer by his primary care nurse practitioner Charles Rich, Charles S, initial evaluation was on July 03, 2017.  He has a long history of epilepsy, was born with mental retardation, used to live with his mother, since his mother passed away in 2006, he moved in with his brother since 2008, was previously under the care of his primary care physician  He used to take Dilantin, was recently put on gabapentin 300 mg 3 times a day since his recent visit with his primary care physician January 2019, niece reported that patient no longer ambulatory, has regular eating, bowel bladder regimen, is usually happy.  He has spells of sudden body flexion, especially with startle, this can happen many episode a day, about couple times each months, he would have a generalized seizure,   There was limited to history, he had a history of seizure disorder, mental retardation, according to epic, he presented to the emergency room in 2017 for recurrent seizure, there was no significant emotional outburst,   REVIEW OF SYSTEMS: Full 14 system review of systems performed and notable only for those listed, all others are neg:  Constitutional: neg  Cardiovascular: neg Ear/Nose/Throat: neg  Skin: neg Eyes: neg Respiratory: neg Gastroitestinal: neg  Hematology/Lymphatic: neg  Endocrine: neg Musculoskeletal:neg Allergy/Immunology: neg Neurological: neg Psychiatric: neg Sleep : neg   ALLERGIES: No Known Allergies  HOME MEDICATIONS: Outpatient Medications Prior to Visit  Medication Sig Dispense Refill  . gabapentin (NEURONTIN) 300 MG capsule TAKE  ONE CAPSULE BY MOUTH THREE TIMES A DAY 90 capsule 1  . levETIRAcetam (KEPPRA) 500 MG tablet Take one tablet in am and two tablets in pm. 90 tablet 0  . Multiple Vitamin (MULTIVITAMIN) capsule Take 1 capsule by mouth daily.     No facility-administered medications prior to visit.     PAST MEDICAL HISTORY: Past Medical History:  Diagnosis Date  . Mental retardation   . Seizures (HCC)     PAST SURGICAL HISTORY: No past surgical history on file.  FAMILY HISTORY: Family History  Problem Relation Age of Onset  . Diabetes Mother     SOCIAL HISTORY: Social History   Socioeconomic History  . Marital status: Single    Spouse name: Not on file  . Number of children: Not on file  . Years of education: Not on file  . Highest education level: Not on file  Occupational History  . Not on file  Social Needs  . Financial resource strain: Not on file  . Food insecurity:    Worry: Not on file    Inability: Not on file  . Transportation needs:    Medical: Not on file    Non-medical: Not on file  Tobacco Use  . Smoking status: Never Smoker  . Smokeless tobacco: Never Used  Substance and Sexual Activity  . Alcohol use: No  . Drug use: No  . Sexual activity: Not on file  Lifestyle  . Physical activity:    Days per week: Not on file    Minutes per session: Not on file  . Stress: Not on file  Relationships  . Social connections:    Talks on phone: Not on file  Gets together: Not on file    Attends religious service: Not on file    Active member of club or organization: Not on file    Attends meetings of clubs or organizations: Not on file    Relationship status: Not on file  . Intimate partner violence:    Fear of current or ex partner: Not on file    Emotionally abused: Not on file    Physically abused: Not on file    Forced sexual activity: Not on file  Other Topics Concern  . Not on file  Social History Narrative  . Not on file     PHYSICAL EXAM  There were no  vitals filed for this visit. There is no height or weight on file to calculate BMI.  Generalized: Well developed, in no acute distress  Head: normocephalic and atraumatic,. Oropharynx benign  Neck: Supple, no carotid bruits  Cardiac: Regular rate rhythm, no murmur  Musculoskeletal: No deformity   Neurological examination   Mentation: Alert oriented to time, place, history taking. Attention span and concentration appropriate. Recent and remote memory intact.  Follows all commands speech and language fluent.   Cranial nerve II-XII: Fundoscopic exam reveals sharp disc margins.Pupils were equal round reactive to light extraocular movements were full, visual field were full on confrontational test. Facial sensation and strength were normal. hearing was intact to finger rubbing bilaterally. Uvula tongue midline. head turning and shoulder shrug were normal and symmetric.Tongue protrusion into cheek strength was normal. Motor: normal bulk and tone, full strength in the BUE, BLE, fine finger movements normal, no pronator drift. No focal weakness Sensory: normal and symmetric to light touch, pinprick, and  Vibration, proprioception  Coordination: finger-nose-finger, heel-to-shin bilaterally, no dysmetria Reflexes: Brachioradialis 2/2, biceps 2/2, triceps 2/2, patellar 2/2, Achilles 2/2, plantar responses were flexor bilaterally. Gait and Station: Rising up from seated position without assistance, normal stance,  moderate stride, good arm swing, smooth turning, able to perform tiptoe, and heel walking without difficulty. Tandem gait is steady  DIAGNOSTIC DATA (LABS, IMAGING, TESTING) - I reviewed patient records, labs, notes, testing and imaging myself where available.  Lab Results  Component Value Date   WBC 7.6 05/02/2017   HGB 13.9 05/02/2017   HCT 42.0 05/02/2017   MCV 85 05/02/2017   PLT 201 05/02/2017      Component Value Date/Time   NA 139 05/02/2017 1512   K 4.1 05/02/2017 1512   CL  102 05/02/2017 1512   CO2 24 05/02/2017 1512   GLUCOSE 85 05/02/2017 1512   GLUCOSE 89 11/02/2015 1002   BUN 13 05/02/2017 1512   CREATININE 0.73 (L) 05/02/2017 1512   CREATININE 0.82 11/02/2015 1002   CALCIUM 9.3 05/02/2017 1512   PROT 6.7 05/02/2017 1512   ALBUMIN 4.1 05/02/2017 1512   AST 16 05/02/2017 1512   ALT 17 05/02/2017 1512   ALKPHOS 52 05/02/2017 1512   BILITOT 0.3 05/02/2017 1512   GFRNONAA 104 05/02/2017 1512   GFRNONAA >89 11/02/2015 1002   GFRAA 120 05/02/2017 1512   GFRAA >89 11/02/2015 1002   Lab Results  Component Value Date   CHOL 204 (H) 11/02/2015   HDL 57 11/02/2015   LDLCALC 134 (H) 11/02/2015   TRIG 64 11/02/2015   CHOLHDL 3.6 11/02/2015   Lab Results  Component Value Date   HGBA1C 5.9 (A) 09/30/2017   No results found for: [id number] Lab Results  Component Value Date   TSH 1.570 05/02/2017    ***  ASSESSMENT AND  PLAN  57 y.o. year old male  has a past medical history of Mental retardation and Seizures (HCC). here with ***  Jovaughn Wojtaszek is a 57 y.o. male   Mental retardation Epilepsy             CT head without contrast             EEG             Stop gabapentin, start Keppra 500 mg twice a day   Nilda Riggs, [facility], [facility], APRN  [facility] [address] [place], [place] [address] [phone]

## 2017-12-03 ENCOUNTER — Ambulatory Visit: Payer: Medicare Other | Admitting: Nurse Practitioner

## 2017-12-03 ENCOUNTER — Encounter: Payer: Self-pay | Admitting: Nurse Practitioner

## 2017-12-03 ENCOUNTER — Telehealth: Payer: Self-pay | Admitting: *Deleted

## 2017-12-03 NOTE — Telephone Encounter (Signed)
Attempted to call and reschedule appt, busy.

## 2017-12-31 ENCOUNTER — Ambulatory Visit: Payer: Medicare Other | Admitting: Family Medicine

## 2018-01-04 ENCOUNTER — Emergency Department (HOSPITAL_COMMUNITY): Payer: Medicare Other

## 2018-01-04 ENCOUNTER — Inpatient Hospital Stay (HOSPITAL_COMMUNITY)
Admission: EM | Admit: 2018-01-04 | Discharge: 2018-01-08 | DRG: 603 | Disposition: A | Discharge status: Home / Self Care | Payer: Medicare Other | Attending: Internal Medicine | Admitting: Internal Medicine

## 2018-01-04 ENCOUNTER — Inpatient Hospital Stay (HOSPITAL_COMMUNITY): Payer: Medicare Other

## 2018-01-04 ENCOUNTER — Encounter (HOSPITAL_COMMUNITY): Payer: Self-pay | Admitting: Emergency Medicine

## 2018-01-04 DIAGNOSIS — E878 Other disorders of electrolyte and fluid balance, not elsewhere classified: Secondary | ICD-10-CM | POA: Diagnosis present

## 2018-01-04 DIAGNOSIS — E870 Hyperosmolality and hypernatremia: Secondary | ICD-10-CM | POA: Diagnosis present

## 2018-01-04 DIAGNOSIS — M24562 Contracture, left knee: Secondary | ICD-10-CM | POA: Diagnosis present

## 2018-01-04 DIAGNOSIS — R1310 Dysphagia, unspecified: Secondary | ICD-10-CM | POA: Diagnosis present

## 2018-01-04 DIAGNOSIS — Z79899 Other long term (current) drug therapy: Secondary | ICD-10-CM

## 2018-01-04 DIAGNOSIS — L02415 Cutaneous abscess of right lower limb: Secondary | ICD-10-CM | POA: Diagnosis present

## 2018-01-04 DIAGNOSIS — E860 Dehydration: Secondary | ICD-10-CM | POA: Diagnosis present

## 2018-01-04 DIAGNOSIS — B9562 Methicillin resistant Staphylococcus aureus infection as the cause of diseases classified elsewhere: Secondary | ICD-10-CM | POA: Diagnosis present

## 2018-01-04 DIAGNOSIS — M24561 Contracture, right knee: Secondary | ICD-10-CM | POA: Diagnosis present

## 2018-01-04 DIAGNOSIS — Z9181 History of falling: Secondary | ICD-10-CM | POA: Diagnosis not present

## 2018-01-04 DIAGNOSIS — F79 Unspecified intellectual disabilities: Secondary | ICD-10-CM | POA: Diagnosis present

## 2018-01-04 DIAGNOSIS — L03115 Cellulitis of right lower limb: Secondary | ICD-10-CM | POA: Diagnosis present

## 2018-01-04 DIAGNOSIS — L0291 Cutaneous abscess, unspecified: Secondary | ICD-10-CM | POA: Diagnosis present

## 2018-01-04 DIAGNOSIS — E876 Hypokalemia: Secondary | ICD-10-CM | POA: Diagnosis present

## 2018-01-04 DIAGNOSIS — G40909 Epilepsy, unspecified, not intractable, without status epilepticus: Secondary | ICD-10-CM | POA: Diagnosis present

## 2018-01-04 LAB — CBC WITH DIFFERENTIAL/PLATELET
ABS IMMATURE GRANULOCYTES: 0.1 10*3/uL (ref 0.0–0.1)
BASOS ABS: 0.1 10*3/uL (ref 0.0–0.1)
Basophils Relative: 1 %
Eosinophils Absolute: 0.4 10*3/uL (ref 0.0–0.7)
Eosinophils Relative: 2 %
HCT: 45.4 % (ref 39.0–52.0)
HEMOGLOBIN: 13.4 g/dL (ref 13.0–17.0)
Immature Granulocytes: 1 %
LYMPHS PCT: 10 %
Lymphs Abs: 1.8 10*3/uL (ref 0.7–4.0)
MCH: 28.4 pg (ref 26.0–34.0)
MCHC: 29.5 g/dL — AB (ref 30.0–36.0)
MCV: 96.2 fL (ref 78.0–100.0)
Monocytes Absolute: 0.7 10*3/uL (ref 0.1–1.0)
Monocytes Relative: 4 %
NEUTROS ABS: 14.3 10*3/uL — AB (ref 1.7–7.7)
Neutrophils Relative %: 82 %
Platelets: 250 10*3/uL (ref 150–400)
RBC: 4.72 MIL/uL (ref 4.22–5.81)
RDW: 13.9 % (ref 11.5–15.5)
WBC: 17.3 10*3/uL — AB (ref 4.0–10.5)

## 2018-01-04 LAB — CBG MONITORING, ED: Glucose-Capillary: 80 mg/dL (ref 70–99)

## 2018-01-04 LAB — BASIC METABOLIC PANEL
BUN: 27 mg/dL — AB (ref 6–20)
BUN: 26 mg/dL — AB (ref 6–20)
CO2: 25 mmol/L (ref 22–32)
CO2: 23 mmol/L (ref 22–32)
CREATININE: 1.22 mg/dL (ref 0.61–1.24)
CREATININE: 1.17 mg/dL (ref 0.61–1.24)
Calcium: 8.4 mg/dL — ABNORMAL LOW (ref 8.9–10.3)
Calcium: 8.2 mg/dL — ABNORMAL LOW (ref 8.9–10.3)
Chloride: >130 mmol/L (ref 98–111)
GFR calc Af Amer: >60 mL/min (ref >60)
GFR calc non Af Amer: >60 mL/min (ref >60)
Glucose, Bld: 103 mg/dL — ABNORMAL HIGH (ref 70–99)
Glucose, Bld: 150 mg/dL — ABNORMAL HIGH (ref 70–99)
Potassium: 4 mmol/L (ref 3.5–5.1)
Potassium: 3.9 mmol/L (ref 3.5–5.1)
Sodium: 169 mmol/L (ref 135–145)
Sodium: 167 mmol/L (ref 135–145)

## 2018-01-04 MED ORDER — ENOXAPARIN SODIUM 40 MG/0.4ML ~~LOC~~ SOLN
40.0000 mg | Freq: Every day | SUBCUTANEOUS | Status: DC
  Administered 2018-01-05 – 2018-01-08 (×4): 40 mg via SUBCUTANEOUS
  Filled 2018-01-04 (×4): qty 0.4

## 2018-01-04 MED ORDER — SODIUM CHLORIDE 0.9 % IV BOLUS
1000.0000 mL | Freq: Once | INTRAVENOUS | Status: AC
  Administered 2018-01-04: 1000 mL via INTRAVENOUS

## 2018-01-04 MED ORDER — CLINDAMYCIN PHOSPHATE 600 MG/50ML IV SOLN
600.0000 mg | Freq: Once | INTRAVENOUS | Status: AC
  Administered 2018-01-04: 600 mg via INTRAVENOUS
  Filled 2018-01-04: qty 50

## 2018-01-04 MED ORDER — DEXTROSE 5 % IV SOLN
INTRAVENOUS | Status: DC
  Administered 2018-01-04: 18:00:00 via INTRAVENOUS

## 2018-01-04 MED ORDER — PHENYTOIN SODIUM EXTENDED 100 MG PO CAPS
100.0000 mg | ORAL_CAPSULE | Freq: Every day | ORAL | Status: DC
  Administered 2018-01-05 – 2018-01-08 (×4): 100 mg via ORAL
  Filled 2018-01-04 (×4): qty 1

## 2018-01-04 MED ORDER — VANCOMYCIN HCL IN DEXTROSE 750-5 MG/150ML-% IV SOLN
750.0000 mg | Freq: Two times a day (BID) | INTRAVENOUS | Status: DC
  Administered 2018-01-05 (×2): 750 mg via INTRAVENOUS
  Filled 2018-01-04 (×3): qty 150

## 2018-01-04 MED ORDER — ADULT MULTIVITAMIN W/MINERALS CH
1.0000 | ORAL_TABLET | Freq: Every day | ORAL | Status: DC
  Administered 2018-01-05 – 2018-01-08 (×4): 1 via ORAL
  Filled 2018-01-04 (×4): qty 1

## 2018-01-04 MED ORDER — PHENYTOIN SODIUM EXTENDED 100 MG PO CAPS: 100.0000 mg | ORAL_CAPSULE | ORAL | Status: DC

## 2018-01-04 MED ORDER — PHENYTOIN SODIUM EXTENDED 100 MG PO CAPS
200.0000 mg | ORAL_CAPSULE | Freq: Every day | ORAL | Status: DC
  Administered 2018-01-04 – 2018-01-07 (×4): 200 mg via ORAL
  Filled 2018-01-04 (×4): qty 2

## 2018-01-04 MED ORDER — DEXTROSE 5 % IV BOLUS
250.0000 mL | Freq: Once | INTRAVENOUS | Status: AC
  Administered 2018-01-04: 250 mL via INTRAVENOUS

## 2018-01-04 MED ORDER — VANCOMYCIN HCL IN DEXTROSE 1-5 GM/200ML-% IV SOLN
1000.0000 mg | Freq: Once | INTRAVENOUS | Status: AC
  Administered 2018-01-05: 1000 mg via INTRAVENOUS
  Filled 2018-01-04: qty 200

## 2018-01-04 NOTE — ED Notes (Signed)
Per lab, bmp results abnormal. RN attempted stick unsuccessful. Phlebotomy notified.

## 2018-01-04 NOTE — ED Notes (Signed)
ED Provider at bedside. 

## 2018-01-04 NOTE — ED Notes (Addendum)
Meal tray given to pt.

## 2018-01-04 NOTE — ED Notes (Signed)
EDP notified of sodium result. NS stopped.

## 2018-01-04 NOTE — ED Provider Notes (Signed)
[facility] EMERGENCY DEPARTMENT Provider Note   CSN: [id number] Arrival date & time: 01/04/18  1022     History   Chief Complaint Chief Complaint  Patient presents with  . Recurrent Skin Infections    HPI Charles Rich is a 57 y.o. male past medical history of epilepsy and mental retardation.  He has contractures of the knees and is nonambulatory and according to EMS the patient only walks on his knees.  EMS was called this morning for infection in the right lower extremity.  There is a level 5 caveat due to MR and history is obtained by chart review and EMS/nursing notes.  According to EMS the patient has had worsening right lower leg swelling and pain over the past 2 days.  I am unsure if the patient has had any fever or chills but he does acknowledge pain in the right lower extremity.  HPI  Past Medical History:  Diagnosis Date  . Mental retardation   . Seizures [facility])     Patient Active Problem List   Diagnosis Date Noted  . Hypernatremia 01/04/2018  . 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 No pertinent surgical history.      Home Medications    Prior to Admission medications   Medication Sig Start Date End Date Taking? Authorizing Provider  Multiple Vitamin (MULTIVITAMIN) capsule Take 1 capsule by mouth daily.   Yes [provider]  phenytoin (DILANTIN) 100 MG ER capsule Take 100 mg by mouth See admin instructions. 100 mg in the morning and 200 mg at night   Yes [provider]  gabapentin (NEURONTIN) 300 MG capsule TAKE ONE CAPSULE BY MOUTH THREE TIMES A DAY Patient not taking: Reported on 01/04/2018 11/06/17   Kallie Locks, FNP  levETIRAcetam (KEPPRA) 500 MG tablet Take one tablet in am and two tablets in pm. Patient not taking: Reported on 01/04/2018 12/01/17   Levert Feinstein, MD    Family History Family History  Problem Relation Age of Onset  . Diabetes Mother      Social History Social History   Tobacco Use  . Smoking status: Never Smoker  . Smokeless tobacco: Never Used  Substance Use Topics  . Alcohol use: No  . Drug use: No     Allergies   Patient has no known allergies.   Review of Systems Review of Systems Unable to review systems secondary to MR  Physical Exam Updated Vital Signs BP (!) 83/54 (BP Location: Right Arm)   Pulse 76   Temp 98 F (36.7 C) (Oral)   Resp 10   Ht 6' (1.829 m)   Wt 65.2 kg   SpO2 100%   BMI 19.49 kg/m   Physical Exam Physical Exam  Nursing note and vitals reviewed. Constitutional: He appears well-developed and well-nourished. No distress.  HENT:  Head: Normocephalic and atraumatic.  Eyes: Conjunctivae normal are normal. No scleral icterus.  Neck: Normal range of motion. Neck supple.  Cardiovascular: Normal rate, regular rhythm and normal heart sounds.   Pulmonary/Chest: Effort normal and breath sounds normal. No respiratory distress.  Abdominal: Soft. There is no tenderness.  Musculoskeletal: Contractures of the bilateral knees.  The right knee is fluctuant over the prepatellar bursa.  There is erythema and tenderness with desquamating tissue about 10 cm in diameter.  There is an area of open ulceration with a little bit of purulent drainage.  Erythema extends down the entire right shin and there is  a bit of lymphangitis tracking medially along the right upper thigh.  Normal DP and PT pulse. Neurological: He is alert.  Skin: Skin is warm and dry. He is not diaphoretic.  Psychiatric: His behavior is normal.     ED Treatments / Results  Labs (all labs ordered are listed, but only abnormal results are displayed) Labs Reviewed  CBC WITH DIFFERENTIAL/PLATELET - Abnormal; Notable for the following components:      Result Value   WBC 17.3 (*)    MCHC 29.5 (*)    Neutro Abs 14.3 (*)    All other components within normal limits  BASIC METABOLIC PANEL - Abnormal; Notable for the following  components:   Sodium 169 (*)    Chloride >130 (*)    Glucose, Bld 103 (*)    BUN 27 (*)    Calcium 8.4 (*)    All other components within normal limits  BASIC METABOLIC PANEL - Abnormal; Notable for the following components:   Sodium 167 (*)    Chloride >130 (*)    Glucose, Bld 150 (*)    BUN 26 (*)    Calcium 8.2 (*)    All other components within normal limits  BASIC METABOLIC PANEL - Abnormal; Notable for the following components:   Sodium 162 (*)    Chloride >130 (*)    Glucose, Bld 182 (*)    BUN 23 (*)    Calcium 7.9 (*)    All other components within normal limits  HIV ANTIBODY (ROUTINE TESTING W REFLEX)  BASIC METABOLIC PANEL  BASIC METABOLIC PANEL  CBG MONITORING, ED    EKG None  Radiology Ct Knee Right Wo Contrast  Result Date: 01/04/2018 CLINICAL DATA:  Right lower extremity infection as question infectious bursitis. EXAM: CT OF THE RIGHT KNEE WITHOUT CONTRAST TECHNIQUE: Multidetector CT imaging of the right knee was performed according to the standard protocol. Multiplanar CT image reconstructions were also generated. COMPARISON:  Plain films today. FINDINGS: Bones/Joint/Cartilage Examination demonstrates mild-to-moderate tricompartmental osteoarthritic change. Couple tiny intra-articular loose bodies. No acute fracture or dislocation. No joint effusion. Ligaments Suboptimally assessed by CT. Muscles and Tendons Unremarkable. Soft tissues Mild diffuse subcutaneous edema. There is moderate edema/soft tissue swelling over the anterior aspect of the infrapatellar soft tissues superficial to the patella tendon extending around to the lateral half of the proximal lower leg. This likely represents an infected fluid collection/abscess in this patient with known cellulitis. Borders of this collection are difficult to identify and therefore difficult to measure although the anterior aspect of this fluid collection measures approximately 2.0 x 5.5 x 5.4 cm in AP, transverse and  craniocaudal dimension. IMPRESSION: Subcutaneous fluid collection most prominent over the anterior infrapatellar region extending around the lateral half of the proximal lower leg. This likely represents an infected fluid collection/subcutaneous abscess in this patient with known cellulitis. Tricompartmental osteoarthritic change. Couple small intra-articular loose bodies. No joint effusion. Electronically Signed   By: Elberta Fortis M.D.   On: 01/04/2018 19:22   Dg Knee Complete 4 Views Right  Result Date: 01/04/2018 CLINICAL DATA:  Knee infection EXAM: RIGHT KNEE - COMPLETE 4+ VIEW COMPARISON:  None. FINDINGS: Mild degenerative narrowing of the medial compartment. At least mild degenerative change at the patellofemoral compartment, with associated osseous spurring. Osseous alignment is otherwise normal. No acute or suspicious osseous lesion. No evidence of acute fracture. No destructive change to suggest osteomyelitis. Chronic calcification adjacent to the medial malleolus, likely indicating previous injury of the MCL. Prominent soft  tissue thickening overlying the patella and proximal tibia, best seen on lateral projection. No soft tissue gas seen. No appreciable joint effusion. IMPRESSION: 1. Prominent soft tissue thickening/swelling involving the superficial soft tissues overlying the patella and proximal tibia, best seen on the lateral view of the RIGHT knee. Cellulitis? No soft tissue gas seen. No evidence of underlying joint effusion. 2. No acute appearing osseous abnormality. 3. Mild degenerative changes of the medial and patellofemoral compartment, as detailed above. Electronically Signed   By: Bary Richard M.D.   On: 01/04/2018 16:47   [place] Rt Lower Extrem Ltd Soft Tissue Non Vascular  Result Date: 01/04/2018 CLINICAL DATA:  Elevated white blood cell count with leg swelling EXAM: ULTRASOUND RIGHT LOWER EXTREMITY LIMITED TECHNIQUE: Ultrasound examination of the lower extremity soft tissues was  performed in the area of clinical concern. COMPARISON:  None. FINDINGS: Sonographic evaluation in the area of clinical concern shows a diffuse area of decreased echogenicity identified in the right lower extremity measuring 11.9 x 2.2 x 5.7 cm. This likely represents a developing abscess. No other focal abnormality is noted. IMPRESSION: Apparent developing subcutaneous abscess in the right lower leg. Electronically Signed   By: Alcide Clever M.D.   On: 01/04/2018 14:40    Procedures Procedures (including critical care time)  Medications Ordered in ED Medications  multivitamin with minerals tablet 1 tablet (has no administration in time range)  enoxaparin (LOVENOX) injection 40 mg (has no administration in time range)  dextrose 5 % solution ( Intravenous Rate/Dose Change 01/04/18 1917)  vancomycin (VANCOCIN) IVPB 750 mg/150 ml premix (has no administration in time range)  phenytoin (DILANTIN) ER capsule 100 mg (has no administration in time range)    And  phenytoin (DILANTIN) ER capsule 200 mg (200 mg Oral Given 01/04/18 2332)  sodium chloride 0.9 % bolus 1,000 mL (0 mLs Intravenous Stopped 01/04/18 1407)  clindamycin (CLEOCIN) IVPB 600 mg (0 mg Intravenous Stopped 01/04/18 1307)  vancomycin (VANCOCIN) IVPB 1000 mg/200 mL premix (1,000 mg Intravenous New Bag/Given 01/05/18 0116)  dextrose 5 % bolus 250 mL (250 mLs Intravenous New Bag/Given 01/04/18 2333)     Initial Impression / Assessment and Plan / ED Course  I have reviewed the triage vital signs and the nursing notes.  Pertinent labs & imaging results that were available during my care of the patient were reviewed by me and considered in my medical decision making (see chart for details).  Clinical Course as of Jan 05 814  Sun Jan 04, 2018  1204 WBC(!): 17.3 [AH]  1442 Patient fluids stopped. I spoke with The patient's brother who is at bedside. He states that the patient has been drinking almost 2 gallons of water daily and is incontinent  of urine but he has noticed that his urine is extremely concentrated and foul-smelling.  The patient takes some antiseizure medications although I do not find that these might be the cause of his hyponatremia.  Question diabetes insipidus as the underlying cause.  Sodium(!!): 169 [AH]  1443 Chloride(!!): >130 [AH]    Clinical Course User Index [AH] Arthor Captain, PA-C    Patient with Hypernatremia/Hyprchloremia of unknown etiology.He appears well hydrated. He is mentating at baseline.The patient received 1 L NS pt lab values returning and these were discontinued by Nursing staff. I have spoken with Dr. Jena Gauss about the patient;s abscess. He asks for a knee xray and CT. He plans to operate on the patient in the morning.  The patient will be admitted to SDU.  He is stable through his visit here in the ED.   Final Clinical Impressions(s) / ED Diagnoses   Final diagnoses:  Abscess    ED Discharge Orders    None       Jasmyn Picha, AbArthor Captainigail, PA-C 01/05/18 0815    Lorre NickAllen, Anthony, MD 01/05/18 1326

## 2018-01-04 NOTE — ED Notes (Signed)
Condom cath placed on pt 

## 2018-01-04 NOTE — ED Notes (Signed)
Soft Diet Tray Ordered

## 2018-01-04 NOTE — Progress Notes (Signed)
Pharmacy Antibiotic Note  Charles Rich is a 57 y.o. male admitted on 01/04/2018 with cellulitis.  Pharmacy has been consulted for vancomycin dosing.  Presenting with R knee and leg swelling with associated redness. US showing possible subcutaneous abscess in R lower leg. WBC 17.3. Scr 1.22 (CrCl ~60 mL/min). Afebrile. Received 1 dose of clindamycin at 1145.   Plan: Vancomycin 1000 mg IV once  Vancomycin 750 mg IV every 12 hours.  Goal trough 10-15 mcg/mL.  Monitor renal function, cx results, clinical pic, and VT as appropriate  Height: 6' (182.9 cm) Weight: 154 lb (69.9 kg) IBW/kg (Calculated) : 77.6  Temp (24hrs), Avg:98.3 F (36.8 C), Min:98.3 F (36.8 C), Max:98.3 F (36.8 C)  Recent Labs  Lab 01/04/18 1111 01/04/18 1238  WBC 17.3*  --   CREATININE  --  1.22    Estimated Creatinine Clearance: 66.8 mL/min (by C-G formula based on SCr of 1.22 mg/dL).    No Known Allergies  Antimicrobials this admission: Vancomycin 9/15 >>  Clindamycin 9/15 x1  Dose adjustments this admission: N/A  Microbiology results: N/A  Thank you for allowing pharmacy to be a part of this patient's care.  Girard CooterKimberly Perkins, PharmD Clinical Pharmacist  Pager: [phone] Phone: [phone] 01/04/2018 7:44 PM

## 2018-01-04 NOTE — ED Triage Notes (Signed)
Pt arrives via [facility] from home for infection of RLE that was first noticed Friday. Ems states leg is swollen with purulent drainage present. Pt unable to provide much information due to cognitive impairments.

## 2018-01-04 NOTE — ED Notes (Signed)
Attempted report 

## 2018-01-04 NOTE — ED Notes (Signed)
Patient transported to Ultrasound 

## 2018-01-04 NOTE — ED Notes (Signed)
Patient transported to CT 

## 2018-01-04 NOTE — H&P (Signed)
History and Physical    Charles Rich ZOX:[id number] DOB: Mar 21, 1961 DOA: 01/04/2018  PCP: Kallie Locks, FNP  Patient coming from: Home with his brother.   I have personally briefly reviewed patient's old medical records in [facility]  Chief Complaint: right knee swelling   HPI: Charles Rich is a 57 y.o. male with medical history significant of seizures and mental retardation was brought in by his brother for right knee and right leg pain and swelling. As per the family at bedside, pt sometimes will try to drag himself on his knees to get from one place to another place, and he has this right knee pain and swelling for the last 2 to 3 days associated with right leg redness. He denies fevers or chills. No nausea or vomiting . Pt is able to answer simple questions with yes and no answer. He currently denies any pain .    ED Course: on Arrival to ED,pt was found to have swollen right knee and with right leg cellulitis. Labs revealed sodium of 169, chloride of 130 and wbc count of 17.3. HE WAS given a dose of clindamycin and orthopedics consulted. X ray of the knees Prominent soft tissue thickening/swelling involving the superficial soft tissues overlying the patella and proximal tibia,. Cellulitis? No soft tissue gas seen. No evidence of underlying joint effusion.  Review of Systems: detailed ROS could not be obtained as pt has MR.   Past Medical History:  Diagnosis Date  . Mental retardation   . Seizures (HCC)     History reviewed. No pertinent surgical history.   reports that he has never smoked. He has never used smokeless tobacco. He reports that he does not drink alcohol or use drugs.  No Known Allergies  Family History  Problem Relation Age of Onset  . Diabetes Mother    Family history reviewed and not pertinent.   Prior to Admission medications   Medication Sig Start Date End Date Taking? Authorizing Provider  Multiple Vitamin (MULTIVITAMIN)  capsule Take 1 capsule by mouth daily.   Yes [provider]  phenytoin (DILANTIN) 100 MG ER capsule Take 100 mg by mouth See admin instructions. 100 mg in the morning and 200 mg at night   Yes [provider]  gabapentin (NEURONTIN) 300 MG capsule TAKE ONE CAPSULE BY MOUTH THREE TIMES A DAY Patient not taking: Reported on 01/04/2018 11/06/17   Kallie Locks, FNP  levETIRAcetam (KEPPRA) 500 MG tablet Take one tablet in am and two tablets in pm. Patient not taking: Reported on 01/04/2018 12/01/17   Levert Feinstein, MD    Physical Exam: Vitals:   01/04/18 1104 01/04/18 1545  BP: 105/72 (!) 97/56  Pulse: 89 85  Resp: 18 16  Temp: 98.3 F (36.8 C)   TempSrc: Oral   SpO2: 98% 99%    Constitutional: NAD, calm, comfortable Vitals:   01/04/18 1104 01/04/18 1545  BP: 105/72 (!) 97/56  Pulse: 89 85  Resp: 18 16  Temp: 98.3 F (36.8 C)   TempSrc: Oral   SpO2: 98% 99%   Eyes: PERRL, lids and conjunctivae normal ENMT: Mucous membranes are moist. Respiratory: clear to auscultation bilaterally, no wheezing, no crackles. Normal respiratory effort.  Cardiovascular: Regular rate and rhythm, no murmurs  Abdomen: no tenderness, no masses palpated. No hepatosplenomegaly. Bowel sounds positive.  Musculoskeletal: swelling of the right knee and erythema of the right leg. .  Skin: erythema of the right lower leg.  Neurologic: alert, able  to answer simple questions with yes or no, contracted lower extremities, able to move all extremities.  Psychiatric:couldn't be assessed.   Labs on Admission: I have personally reviewed following labs and imaging studies  CBC: Recent Labs  Lab 01/04/18 1111  WBC 17.3*  NEUTROABS 14.3*  HGB 13.4  HCT 45.4  MCV 96.2  PLT 250   Basic Metabolic Panel: Recent Labs  Lab 01/04/18 1238  NA 169*  K 4.0  CL >130*  CO2 25  GLUCOSE 103*  BUN 27*  CREATININE 1.22  CALCIUM 8.4*   GFR: CrCl cannot be calculated (Unknown ideal weight.). Liver  Function Tests: No results for input(s): AST, ALT, ALKPHOS, BILITOT, PROT, ALBUMIN in the last 168 hours. No results for input(s): LIPASE, AMYLASE in the last 168 hours. No results for input(s): AMMONIA in the last 168 hours. Coagulation Profile: No results for input(s): INR, PROTIME in the last 168 hours. Cardiac Enzymes: No results for input(s): CKTOTAL, CKMB, CKMBINDEX, TROPONINI in the last 168 hours. BNP (last 3 results) No results for input(s): PROBNP in the last 8760 hours. HbA1C: No results for input(s): HGBA1C in the last 72 hours. CBG: Recent Labs  Lab 01/04/18 1726  GLUCAP 80   Lipid Profile: No results for input(s): CHOL, HDL, LDLCALC, TRIG, CHOLHDL, LDLDIRECT in the last 72 hours. Thyroid Function Tests: No results for input(s): TSH, T4TOTAL, FREET4, T3FREE, THYROIDAB in the last 72 hours. Anemia Panel: No results for input(s): VITAMINB12, FOLATE, FERRITIN, TIBC, IRON, RETICCTPCT in the last 72 hours. Urine analysis:    Component Value Date/Time   COLORURINE YELLOW 08/15/2015 1350   APPEARANCEUR CLEAR 08/15/2015 1350   LABSPEC 1.025 08/15/2015 1350   PHURINE 5.5 08/15/2015 1350   GLUCOSEU NEGATIVE 08/15/2015 1350   HGBUR LARGE (A) 08/15/2015 1350   BILIRUBINUR NEGATIVE 08/15/2015 1350   KETONESUR 15 (A) 08/15/2015 1350   PROTEINUR 100 (A) 08/15/2015 1350   NITRITE NEGATIVE 08/15/2015 1350   LEUKOCYTESUR NEGATIVE 08/15/2015 1350    Radiological Exams on Admission: Dg Knee Complete 4 Views Right  Result Date: 01/04/2018 CLINICAL DATA:  Knee infection EXAM: RIGHT KNEE - COMPLETE 4+ VIEW COMPARISON:  None. FINDINGS: Mild degenerative narrowing of the medial compartment. At least mild degenerative change at the patellofemoral compartment, with associated osseous spurring. Osseous alignment is otherwise normal. No acute or suspicious osseous lesion. No evidence of acute fracture. No destructive change to suggest osteomyelitis. Chronic calcification adjacent to the  medial malleolus, likely indicating previous injury of the MCL. Prominent soft tissue thickening overlying the patella and proximal tibia, best seen on lateral projection. No soft tissue gas seen. No appreciable joint effusion. IMPRESSION: 1. Prominent soft tissue thickening/swelling involving the superficial soft tissues overlying the patella and proximal tibia, best seen on the lateral view of the RIGHT knee. Cellulitis? No soft tissue gas seen. No evidence of underlying joint effusion. 2. No acute appearing osseous abnormality. 3. Mild degenerative changes of the medial and patellofemoral compartment, as detailed above. Electronically Signed   By: Bary RichardStan  Maynard M.D.   On: 01/04/2018 16:47   [place] Rt Lower Extrem Ltd Soft Tissue Non Vascular  Result Date: 01/04/2018 CLINICAL DATA:  Elevated white blood cell count with leg swelling EXAM: ULTRASOUND RIGHT LOWER EXTREMITY LIMITED TECHNIQUE: Ultrasound examination of the lower extremity soft tissues was performed in the area of clinical concern. COMPARISON:  None. FINDINGS: Sonographic evaluation in the area of clinical concern shows a diffuse area of decreased echogenicity identified in the right lower extremity measuring 11.9  x 2.2 x 5.7 cm. This likely represents a developing abscess. No other focal abnormality is noted. IMPRESSION: Apparent developing subcutaneous abscess in the right lower leg. Electronically Signed   By: Alcide Clever M.D.   On: 01/04/2018 14:40    EKG: not done.   Assessment/Plan Active Problems:   Hypernatremia  cellulitis and abscess of the right knee with surrounding cellulitis: Admit for IV antibiotics.  Started him on IV vancomycin.  Elevate the leg.  Orthopedics consulted by EDP, plan for OR in am,. CT knee ordered .  Pain control with tylenol and tramadol.    Hypernatremia:  Pt does not look dehydrated.  He was given 1lit of NS fluids in ED.  Check BMP every 8 hours.  Monitor him in Step down.  Gentle hydration with  dextrose fluids.    Mental retardation: stable no agitation.   Seizures: Resume home meds.    Leukocytosis: Probably from cellulitis.     DVT prophylaxis: lovenox.  Code Status:  Full code.  Family Communication: discussed with brother at bedside.  Disposition Plan: pending clinical improvement.  Consults called: ortho called.  Admission status: inpatient/step down.    Kathlen Mody MD [facility] Pager [phone]  If 7PM-7AM, please contact night-coverage [email or web address] Password [facility]  01/04/2018, 6:46 PM

## 2018-01-05 ENCOUNTER — Other Ambulatory Visit: Payer: Self-pay

## 2018-01-05 LAB — BASIC METABOLIC PANEL
ANION GAP: 8 (ref 5–15)
ANION GAP: 10 (ref 5–15)
ANION GAP: 10 (ref 5–15)
BUN: 23 mg/dL — ABNORMAL HIGH (ref 6–20)
BUN: 19 mg/dL (ref 6–20)
BUN: 18 mg/dL (ref 6–20)
BUN: 17 mg/dL (ref 6–20)
CALCIUM: 7.9 mg/dL — AB (ref 8.9–10.3)
CHLORIDE: 126 mmol/L — AB (ref 98–111)
CHLORIDE: 126 mmol/L — AB (ref 98–111)
CHLORIDE: 124 mmol/L — AB (ref 98–111)
CO2: 23 mmol/L (ref 22–32)
CO2: 26 mmol/L (ref 22–32)
CO2: 22 mmol/L (ref 22–32)
CO2: 23 mmol/L (ref 22–32)
CREATININE: 1.04 mg/dL (ref 0.61–1.24)
Calcium: 7.9 mg/dL — ABNORMAL LOW (ref 8.9–10.3)
Calcium: 7.8 mg/dL — ABNORMAL LOW (ref 8.9–10.3)
Calcium: 7.8 mg/dL — ABNORMAL LOW (ref 8.9–10.3)
Creatinine, Ser: 1.03 mg/dL (ref 0.61–1.24)
Creatinine, Ser: 0.98 mg/dL (ref 0.61–1.24)
Creatinine, Ser: 1 mg/dL (ref 0.61–1.24)
GFR calc Af Amer: >60 mL/min (ref >60)
GFR calc Af Amer: >60 mL/min (ref >60)
GFR calc Af Amer: >60 mL/min (ref >60)
GFR calc Af Amer: >60 mL/min (ref >60)
GFR calc non Af Amer: >60 mL/min (ref >60)
GLUCOSE: 136 mg/dL — AB (ref 70–99)
GLUCOSE: 121 mg/dL — AB (ref 70–99)
Glucose, Bld: 182 mg/dL — ABNORMAL HIGH (ref 70–99)
Glucose, Bld: 113 mg/dL — ABNORMAL HIGH (ref 70–99)
POTASSIUM: 3.3 mmol/L — AB (ref 3.5–5.1)
POTASSIUM: 3.2 mmol/L — AB (ref 3.5–5.1)
Potassium: 3.7 mmol/L (ref 3.5–5.1)
Potassium: 3.5 mmol/L (ref 3.5–5.1)
SODIUM: 162 mmol/L — AB (ref 135–145)
SODIUM: 158 mmol/L — AB (ref 135–145)
SODIUM: 157 mmol/L — AB (ref 135–145)
Sodium: 160 mmol/L — ABNORMAL HIGH (ref 135–145)

## 2018-01-05 LAB — SYNOVIAL CELL COUNT + DIFF, W/ CRYSTALS
Crystals, Fluid: NONE SEEN
Lymphocytes-Synovial Fld: 7 % (ref 0–20)
MONOCYTE-MACROPHAGE-SYNOVIAL FLUID: 83 % (ref 50–90)
NEUTROPHIL, SYNOVIAL: 10 % (ref 0–25)
WBC, Synovial: 63 /mm3 (ref 0–200)

## 2018-01-05 LAB — HIV ANTIBODY (ROUTINE TESTING W REFLEX): HIV Screen 4th Generation wRfx: NONREACTIVE

## 2018-01-05 LAB — MRSA PCR SCREENING: MRSA BY PCR: POSITIVE — AB

## 2018-01-05 LAB — PROTIME-INR
INR: 1.36
Prothrombin Time: 16.6 seconds — ABNORMAL HIGH (ref 11.4–15.2)

## 2018-01-05 LAB — OSMOLALITY: OSMOLALITY: 337 mosm/kg — AB (ref 275–295)

## 2018-01-05 MED ORDER — LEVETIRACETAM 500 MG PO TABS
500.0000 mg | ORAL_TABLET | Freq: Every morning | ORAL | Status: DC
  Administered 2018-01-05 – 2018-01-08 (×4): 500 mg via ORAL
  Filled 2018-01-05 (×4): qty 1

## 2018-01-05 MED ORDER — DEXTROSE-NACL 5-0.45 % IV SOLN
INTRAVENOUS | Status: DC
  Administered 2018-01-05: 11:00:00 via INTRAVENOUS

## 2018-01-05 MED ORDER — ACETAMINOPHEN 325 MG PO TABS
650.0000 mg | ORAL_TABLET | Freq: Four times a day (QID) | ORAL | Status: DC | PRN
  Administered 2018-01-05: 650 mg via ORAL
  Filled 2018-01-05: qty 2

## 2018-01-05 MED ORDER — POTASSIUM CHLORIDE CRYS ER 20 MEQ PO TBCR
40.0000 meq | EXTENDED_RELEASE_TABLET | Freq: Once | ORAL | Status: AC
  Administered 2018-01-05: 40 meq via ORAL
  Filled 2018-01-05: qty 2

## 2018-01-05 MED ORDER — LEVETIRACETAM 500 MG PO TABS
1000.0000 mg | ORAL_TABLET | Freq: Every evening | ORAL | Status: DC
  Administered 2018-01-05 – 2018-01-07 (×3): 1000 mg via ORAL
  Filled 2018-01-05 (×3): qty 2

## 2018-01-05 MED ORDER — SODIUM CHLORIDE 0.45 % IV SOLN
INTRAVENOUS | Status: DC
  Administered 2018-01-05: 15:00:00 via INTRAVENOUS

## 2018-01-05 NOTE — Progress Notes (Signed)
[facility] Progress Note  Patient: Charles Rich ZOX:[id number]   PCP: Kallie Locks, FNP DOB: 11/01/1960   DOA: 01/04/2018   DOS: 01/05/2018   Date of Service: the patient was seen and examined on 01/05/2018  Subjective: Patient is awake, denies any acute complaint no nausea no vomiting has chronic contractures.  Has pain at the right knee.  Unable to complete full ROS but no nausea no vomiting no diarrhea no constipation.  Had some fever this morning.  Brief hospital course: Pt. with PMH of seizures, mental retardation; admitted on 01/04/2018, presented with complaint of fall and right knee swelling, was found to have hypernatremia and cellulitis of the right knee. Currently further plan is continue IV antibiotics.  Assessment and Plan: 1.  Hypernatremia.  Hyperchloremia Dehydration. Likely poor p.o. intake as well. Patient was started on D5, received D5 bolus as well. Currently transition him to half-normal saline due to rapid correction rate. No focal deficit on examination. Was slightly high. Likely this is from poor p.o. intake. Continue to monitor BMP every 4 hours. Continue to monitor in the stepdown unit.  2.  Right knee cellulitis. Possible subcutaneous abscess. Appreciate orthopedic input. Patient underwent bedside aspiration. Analysis does not suggest any significant leukocytosis. For now we will continue with IV vancomycin since MRSA PCR is positive. Do not think the patient has septic arthritis.  3.  History of seizures. Resuming home regimen.  4. Mental retardation. Management per outpatient regimen.  5.  Concern for dysphagia. Speech therapy consulted, patient will be on soft diet.  6.  Mild hypokalemia. Replacing.  Diet: soft diet DVT Prophylaxis: subcutaneous Heparin  Advance goals of care discussion: full code  Family Communication: no family was present at bedside, at the time of interview.   Disposition:  Discharge to be  determined .  Consultants: orthopedics  Procedures: Bedside right knee aspiration  Antibiotics: Anti-infectives (From admission, onward)   Start     Dose/Rate Route Frequency Ordered Stop   01/05/18 0800  vancomycin (VANCOCIN) IVPB 750 mg/150 ml premix     750 mg 150 mL/hr over 60 Minutes Intravenous Every 12 hours 01/04/18 1944     01/04/18 2000  vancomycin (VANCOCIN) IVPB 1000 mg/200 mL premix     1,000 mg 200 mL/hr over 60 Minutes Intravenous  Once 01/04/18 1902 01/05/18 0216   01/04/18 1130  clindamycin (CLEOCIN) IVPB 600 mg     600 mg 100 mL/hr over 30 Minutes Intravenous  Once 01/04/18 1124 01/04/18 1307       Objective: Physical Exam: Vitals:   01/05/18 0046 01/05/18 0143 01/05/18 0300 01/05/18 0443  BP: (!) 89/47 (!) 92/56 (!) 83/54 93/62  Pulse: 77 80 76 66  Resp: 14 16 10 15   Temp: 98.2 F (36.8 C)  98 F (36.7 C)   TempSrc: Oral  Oral   SpO2: 99% 100% 100% 100%  Weight:      Height:        Intake/Output Summary (Last 24 hours) at 01/05/2018 1640 Last data filed at 01/05/2018 1500 Gross per 24 hour  Intake 325 ml  Output 400 ml  Net -75 ml   Filed Weights   01/04/18 1937 01/04/18 2145  Weight: 69.9 kg 65.2 kg   General: Alert, Awake and Oriented to Time and Person. Appear in moderate distress, affect appropriate Eyes: PERRL, Conjunctiva normal ENT: Oral Mucosa clear dry. Neck: no JVD, no Abnormal Mass Or lumps Cardiovascular: S1 and S2 Present, no Murmur, Peripheral Pulses Present Respiratory:  normal respiratory effort, Bilateral Air entry equal and Decreased, no use of accessory muscle, Clear to Auscultation, no Crackles, no wheezes Abdomen: Bowel Sound present, Soft and no tenderness, no hernia Skin: right knee redness, no Rash, no induration Extremities: right knee Pedal edema, no calf tenderness Neurologic: Grossly no focal neuro deficit. Bilaterally Equal motor strength  Data Reviewed: CBC: Recent Labs  Lab 01/04/18 1111  WBC 17.3*    NEUTROABS 14.3*  HGB 13.4  HCT 45.4  MCV 96.2  PLT 250   Basic Metabolic Panel: Recent Labs  Lab 01/04/18 1238 01/04/18 2043 01/05/18 0234 01/05/18 1015 01/05/18 1251  NA 169* 167* 162* 160* 158*  K 4.0 3.9 3.7 3.5 3.3*  CL >130* >130* >130* 126* 126*  CO2 25 23 23 26 22   GLUCOSE 103* 150* 182* 136* 121*  BUN 27* 26* 23* 19 18  CREATININE 1.22 1.17 1.04 1.03 0.98  CALCIUM 8.4* 8.2* 7.9* 7.9* 7.8*    Liver Function Tests: No results for input(s): AST, ALT, ALKPHOS, BILITOT, PROT, ALBUMIN in the last 168 hours. No results for input(s): LIPASE, AMYLASE in the last 168 hours. No results for input(s): AMMONIA in the last 168 hours. Coagulation Profile: Recent Labs  Lab 01/05/18 1015  INR 1.36   Cardiac Enzymes: No results for input(s): CKTOTAL, CKMB, CKMBINDEX, TROPONINI in the last 168 hours. BNP (last 3 results) No results for input(s): PROBNP in the last 8760 hours. CBG: Recent Labs  Lab 01/04/18 1726  GLUCAP 80   Studies: Ct Knee Right Wo Contrast  Result Date: 01/04/2018 CLINICAL DATA:  Right lower extremity infection as question infectious bursitis. EXAM: CT OF THE RIGHT KNEE WITHOUT CONTRAST TECHNIQUE: Multidetector CT imaging of the right knee was performed according to the standard protocol. Multiplanar CT image reconstructions were also generated. COMPARISON:  Plain films today. FINDINGS: Bones/Joint/Cartilage Examination demonstrates mild-to-moderate tricompartmental osteoarthritic change. Couple tiny intra-articular loose bodies. No acute fracture or dislocation. No joint effusion. Ligaments Suboptimally assessed by CT. Muscles and Tendons Unremarkable. Soft tissues Mild diffuse subcutaneous edema. There is moderate edema/soft tissue swelling over the anterior aspect of the infrapatellar soft tissues superficial to the patella tendon extending around to the lateral half of the proximal lower leg. This likely represents an infected fluid collection/abscess in  this patient with known cellulitis. Borders of this collection are difficult to identify and therefore difficult to measure although the anterior aspect of this fluid collection measures approximately 2.0 x 5.5 x 5.4 cm in AP, transverse and craniocaudal dimension. IMPRESSION: Subcutaneous fluid collection most prominent over the anterior infrapatellar region extending around the lateral half of the proximal lower leg. This likely represents an infected fluid collection/subcutaneous abscess in this patient with known cellulitis. Tricompartmental osteoarthritic change. Couple small intra-articular loose bodies. No joint effusion. Electronically Signed   By: Elberta Fortisaniel  Boyle M.D.   On: 01/04/2018 19:22   Dg Knee Complete 4 Views Right  Result Date: 01/04/2018 CLINICAL DATA:  Knee infection EXAM: RIGHT KNEE - COMPLETE 4+ VIEW COMPARISON:  None. FINDINGS: Mild degenerative narrowing of the medial compartment. At least mild degenerative change at the patellofemoral compartment, with associated osseous spurring. Osseous alignment is otherwise normal. No acute or suspicious osseous lesion. No evidence of acute fracture. No destructive change to suggest osteomyelitis. Chronic calcification adjacent to the medial malleolus, likely indicating previous injury of the MCL. Prominent soft tissue thickening overlying the patella and proximal tibia, best seen on lateral projection. No soft tissue gas seen. No appreciable joint effusion. IMPRESSION: 1.  Prominent soft tissue thickening/swelling involving the superficial soft tissues overlying the patella and proximal tibia, best seen on the lateral view of the RIGHT knee. Cellulitis? No soft tissue gas seen. No evidence of underlying joint effusion. 2. No acute appearing osseous abnormality. 3. Mild degenerative changes of the medial and patellofemoral compartment, as detailed above. Electronically Signed   By: Bary Richard M.D.   On: 01/04/2018 16:47    Scheduled Meds: .  enoxaparin (LOVENOX) injection  40 mg Subcutaneous Daily  . levETIRAcetam  1,000 mg Oral QPM  . levETIRAcetam  500 mg Oral q morning - 10a  . multivitamin with minerals  1 tablet Oral Daily  . phenytoin  100 mg Oral Daily   And  . phenytoin  200 mg Oral QHS  . potassium chloride  40 mEq Oral Once   Continuous Infusions: . sodium chloride 50 mL/hr at 01/05/18 1446  . vancomycin 750 mg (01/05/18 0837)   PRN Meds: acetaminophen  Time spent: 35 minutes  Author: Lynden Oxford, MD [facility] Pager: [phone] 01/05/2018 4:40 PM  If 7PM-7AM, please contact night-coverage at [email or web address], password [facility]

## 2018-01-05 NOTE — Progress Notes (Signed)
RN assisted night shift RN during shift change to clean pt's right knee site that had been drained from today, and had increased serosanguineous fluid coming out of it tonight. Site cleaned, covered w/ 4x4 gauze, and krelix.

## 2018-01-05 NOTE — Consult Note (Signed)
Reason for Consult:Right lower leg cellulitis Referring Physician: [place] is an 57 y.o. male.  HPI: Charles Rich was brought to the ED by his brother with a 2-3d hx/o right leg pain and swelling. He sometimes will drag himself on his knees to get to places. He has mental retardation and cannot contribute much to history which was gleaned from the chart. He has localized pain in his right lower leg.  Past Medical History:  Diagnosis Date  . Mental retardation   . Seizures ([place])     History reviewed. No pertinent surgical history.  Family History  Problem Relation Age of Onset  . Diabetes Mother     Social History:  reports that he has never smoked. He has never used smokeless tobacco. He reports that he does not drink alcohol or use drugs.  Allergies: No Known Allergies  Medications: I have reviewed the patient's current medications.  Results for orders placed or performed during the hospital encounter of 01/04/18 (from the past 48 hour(s))  CBC with Differential     Status: Abnormal   Collection Time: 01/04/18 11:11 AM  Result Value Ref Range   WBC 17.3 (H) 4.0 - 10.5 K/uL   RBC 4.72 4.22 - 5.81 MIL/uL   Hemoglobin 13.4 13.0 - 17.0 g/dL   HCT 45.4 39.0 - 52.0 %   MCV 96.2 78.0 - 100.0 fL   MCH 28.4 26.0 - 34.0 pg   MCHC 29.5 (L) 30.0 - 36.0 g/dL   RDW 13.9 11.5 - 15.5 %   Platelets 250 150 - 400 K/uL   Neutrophils Relative % 82 %   Neutro Abs 14.3 (H) 1.7 - 7.7 K/uL   Lymphocytes Relative 10 %   Lymphs Abs 1.8 0.7 - 4.0 K/uL   Monocytes Relative 4 %   Monocytes Absolute 0.7 0.1 - 1.0 K/uL   Eosinophils Relative 2 %   Eosinophils Absolute 0.4 0.0 - 0.7 K/uL   Basophils Relative 1 %   Basophils Absolute 0.1 0.0 - 0.1 K/uL   Immature Granulocytes 1 %   Abs Immature Granulocytes 0.1 0.0 - 0.1 K/uL    Comment: Performed at [facility], [address], [place], [place] [address]  Basic metabolic panel     Status: Abnormal   Collection Time:  01/04/18 12:38 PM  Result Value Ref Range   Sodium 169 (HH) 135 - 145 mmol/L    Comment: CRITICAL RESULT CALLED TO, READ BACK BY AND VERIFIED WITH: K.[facility] RN @ 1352 9.15.19 BY C.EDENS    Potassium 4.0 3.5 - 5.1 mmol/L   Chloride >130 (HH) 98 - 111 mmol/L    Comment: CRITICAL RESULT CALLED TO, READ BACK BY AND VERIFIED WITH: K.[facility] RN @ 8563 01/04/18 BY C.EDENS    CO2 25 22 - 32 mmol/L   Glucose, Bld 103 (H) 70 - 99 mg/dL   BUN 27 (H) 6 - 20 mg/dL   Creatinine, Ser 1.22 0.61 - 1.24 mg/dL   Calcium 8.4 (L) 8.9 - 10.3 mg/dL   GFR calc non Af Amer >60 >60 mL/min   GFR calc Af Amer >60 >60 mL/min    Comment: (NOTE) The eGFR has been calculated using the CKD EPI equation. This calculation has not been validated in all clinical situations. eGFR's persistently <60 mL/min signify possible Chronic Kidney Disease.    Anion gap NOT CALCULATED 5 - 15    Comment: Performed at [facility] [address], [place], [place]  [address]  CBG monitoring, ED     Status: None   Collection Time: 01/04/18  5:26 PM  Result Value Ref Range   Glucose-Capillary 80 70 - 99 mg/dL  Basic metabolic panel     Status: Abnormal   Collection Time: 01/04/18  8:43 PM  Result Value Ref Range   Sodium 167 (HH) 135 - 145 mmol/L    Comment: CRITICAL RESULT CALLED TO, READ BACK BY AND VERIFIED WITH: [facility] 01/04/18 2143 WAYK    Potassium 3.9 3.5 - 5.1 mmol/L   Chloride >130 (HH) 98 - 111 mmol/L    Comment: CRITICAL RESULT CALLED TO, READ BACK BY AND VERIFIED WITH: DOBSON T,RN 01/04/18 2143 WAYK    CO2 23 22 - 32 mmol/L   Glucose, Bld 150 (H) 70 - 99 mg/dL   BUN 26 (H) 6 - 20 mg/dL   Creatinine, Ser 1.17 0.61 - 1.24 mg/dL   Calcium 8.2 (L) 8.9 - 10.3 mg/dL   GFR calc non Af Amer >60 >60 mL/min   GFR calc Af Amer >60 >60 mL/min    Comment: (NOTE) The eGFR has been calculated using the CKD EPI equation. This calculation has not been validated in all clinical situations. eGFR's persistently <60 mL/min  signify possible Chronic Kidney Disease. Performed at [facility], [place] [address] [place], [place] [address]   Basic metabolic panel     Status: Abnormal   Collection Time: 01/05/18  2:34 AM  Result Value Ref Range   Sodium 162 (HH) 135 - 145 mmol/L    Comment: CRITICAL RESULT CALLED TO, READ BACK BY AND VERIFIED WITH: DOBSON T,RN 01/05/18 0416 WAYK    Potassium 3.7 3.5 - 5.1 mmol/L   Chloride >130 (HH) 98 - 111 mmol/L    Comment: CRITICAL RESULT CALLED TO, READ BACK BY AND VERIFIED WITH: DOBSON T,RN 01/05/18 0416 WAYK    CO2 23 22 - 32 mmol/L   Glucose, Bld 182 (H) 70 - 99 mg/dL   BUN 23 (H) 6 - 20 mg/dL   Creatinine, Ser 1.04 0.61 - 1.24 mg/dL   Calcium 7.9 (L) 8.9 - 10.3 mg/dL   GFR calc non Af Amer >60 >60 mL/min   GFR calc Af Amer >60 >60 mL/min    Comment: (NOTE) The eGFR has been calculated using the CKD EPI equation. This calculation has not been validated in all clinical situations. eGFR's persistently <60 mL/min signify possible Chronic Kidney Disease. Performed at [facility], [place] [address] [place], [place] [address]     Ct Knee Right Wo Contrast  Result Date: 01/04/2018 CLINICAL DATA:  Right lower extremity infection as question infectious bursitis. EXAM: CT OF THE RIGHT KNEE WITHOUT CONTRAST TECHNIQUE: Multidetector CT imaging of the right knee was performed according to the standard protocol. Multiplanar CT image reconstructions were also generated. COMPARISON:  Plain films today. FINDINGS: Bones/Joint/Cartilage Examination demonstrates mild-to-moderate tricompartmental osteoarthritic change. Couple tiny intra-articular loose bodies. No acute fracture or dislocation. No joint effusion. Ligaments Suboptimally assessed by CT. Muscles and Tendons Unremarkable. Soft tissues Mild diffuse subcutaneous edema. There is moderate edema/soft tissue swelling over the anterior aspect of the infrapatellar soft tissues superficial to the patella tendon extending  around to the lateral half of the proximal lower leg. This likely represents an infected fluid collection/abscess in this patient with known cellulitis. Borders of this collection are difficult to identify and therefore difficult to measure although the anterior aspect of this fluid collection measures approximately 2.0 x 5.5  x 5.4 cm in AP, transverse and craniocaudal dimension. IMPRESSION: Subcutaneous fluid collection most prominent over the anterior infrapatellar region extending around the lateral half of the proximal lower leg. This likely represents an infected fluid collection/subcutaneous abscess in this patient with known cellulitis. Tricompartmental osteoarthritic change. Couple small intra-articular loose bodies. No joint effusion. Electronically Signed   By: Marin Olp M.D.   On: 01/04/2018 19:22   Dg Knee Complete 4 Views Right  Result Date: 01/04/2018 CLINICAL DATA:  Knee infection EXAM: RIGHT KNEE - COMPLETE 4+ VIEW COMPARISON:  None. FINDINGS: Mild degenerative narrowing of the medial compartment. At least mild degenerative change at the patellofemoral compartment, with associated osseous spurring. Osseous alignment is otherwise normal. No acute or suspicious osseous lesion. No evidence of acute fracture. No destructive change to suggest osteomyelitis. Chronic calcification adjacent to the medial malleolus, likely indicating previous injury of the MCL. Prominent soft tissue thickening overlying the patella and proximal tibia, best seen on lateral projection. No soft tissue gas seen. No appreciable joint effusion. IMPRESSION: 1. Prominent soft tissue thickening/swelling involving the superficial soft tissues overlying the patella and proximal tibia, best seen on the lateral view of the RIGHT knee. Cellulitis? No soft tissue gas seen. No evidence of underlying joint effusion. 2. No acute appearing osseous abnormality. 3. Mild degenerative changes of the medial and patellofemoral compartment, as  detailed above. Electronically Signed   By: Franki Cabot M.D.   On: 01/04/2018 16:47   [place] Soft Tissue Non Vascular  Result Date: 01/04/2018 CLINICAL DATA:  Elevated white blood cell count with leg swelling EXAM: ULTRASOUND RIGHT LOWER EXTREMITY LIMITED TECHNIQUE: Ultrasound examination of the lower extremity soft tissues was performed in the area of clinical concern. COMPARISON:  None. FINDINGS: Sonographic evaluation in the area of clinical concern shows a diffuse area of decreased echogenicity identified in the right lower extremity measuring 11.9 x 2.2 x 5.7 cm. This likely represents a developing abscess. No other focal abnormality is noted. IMPRESSION: Apparent developing subcutaneous abscess in the right lower leg. Electronically Signed   By: Inez Catalina M.D.   On: 01/04/2018 14:40    Review of Systems  Unable to perform ROS: Mental acuity   Blood pressure 93/62, pulse 66, temperature 98 F (36.7 C), temperature source Oral, resp. rate 15, height 6' (1.829 m), weight 65.2 kg, SpO2 100 %. Physical Exam  Constitutional: He appears well-developed and well-nourished. No distress.  HENT:  Head: Normocephalic and atraumatic.  Eyes: Conjunctivae are normal. Right eye exhibits no discharge. Left eye exhibits no discharge. No scleral icterus.  Neck: Normal range of motion.  Cardiovascular: Normal rate and regular rhythm.  Respiratory: Effort normal. No respiratory distress.  Musculoskeletal:  RLE No traumatic wounds, ecchymosis, or rash  Boggy, TTP, erythematous distal to tibial tuberosity, no pain with knee ROM  No ankle effusion  Sens DPN, SPN, TN intact  Motor EHL, ext, flex, evers 5/5  DP 2+, PT 2+, No significant edema  Neurological: He is alert.  Skin: Skin is warm and dry. He is not diaphoretic.  Psychiatric: He has a normal mood and affect. His behavior is normal.   Procedure: Right knee/lower leg aspirations Findings: No prepatellar bursal fluid collection  encountered. Obtained synovial fluid which was clear and yellow without e/o purulence. A second aspiration was performed in the boggy area distal to the tibial tuberosity and a small amount of purulent material was aspirated.  Assessment/Plan: Right lower leg cellulitis -- Despite appearance on  CT this appears to be more a cellulitis/abscess of the lower leg as opposed to a bursitis. In any event, would continue IV abx. I did not encounter a large fluid collection with aspiration attempts so doubt formal I&D would be of benefit at this point. Fluid aspiration samples have been sent for micro to aid in treatment.    Lisette Abu, PA-C Orthopedic Surgery [phone] 01/05/2018, 9:27 AM

## 2018-01-05 NOTE — Progress Notes (Signed)
Pt SBP in the 80's and documented in flow sheet. Pt asymptomatic. Linton FlemingsX. Blount, NP notified. Orders given for bolus and carried out. Will continue to monitor.

## 2018-01-05 NOTE — Evaluation (Signed)
Clinical/Bedside Swallow Evaluation Patient Details  Name: Charles Rich MRN: [id number] Date of Birth: 10/22/1960  Today's Date: 01/05/2018 Time: SLP Start Time (ACUTE ONLY): 1355 SLP Stop Time (ACUTE ONLY): 1412 SLP Time Calculation (min) (ACUTE ONLY): 17 min  Past Medical History:  Past Medical History:  Diagnosis Date  . Mental retardation   . Seizures ([facility])    Past Surgical History: History reviewed. No pertinent surgical history. HPI:  Charles BridgeJoseph Anthony Gaxiola is a 57 y.o. male with medical history significant of epilepsy and mental retardation. Per chart, pt was admitted to ED 9/15 for right knee and right leg pain and swelling, cellulitis. Per MD, BSE was ordered to assess appropriate diet texture.   Assessment / Plan / Recommendation Clinical Impression  Pt was alert and pleasant throughout BSE. Due to baseline level cognitive funtioning, oral assessment was limited, however lingual and labial strength and ROM appeared grossly in tact for tasks assessed and volitional cough was strong. Pt accepted thin and puree PO's without overt s/s aspiration, although impulsivity evident in pt's tendency to take large, quick bites and sips when self-feeding without verbal and tactile cues from SLP. He exhibited immediate cough X1 with solid PO, also likely attributed to implusivity/insufficient mastication prior to swallow. Recommend continue current D3 (mechanical soft) diet, thin liquids, straws allowed, and full supervision to cue pt to take small, slow bites/sips. No ST treatment indicated at this time.    SLP Visit Diagnosis: Dysphagia, unspecified (R13.10)    Aspiration Risk  Mild aspiration risk    Diet Recommendation Dysphagia 3 (Mech soft);Thin liquid   Liquid Administration via: Cup;Straw Medication Administration: Whole meds with puree Supervision: Staff to assist with self feeding;Full supervision/cueing for compensatory strategies Compensations: Minimize environmental  distractions;Slow rate;Small sips/bites;Lingual sweep for clearance of pocketing Postural Changes: Seated upright at 90 degrees    Other  Recommendations Oral Care Recommendations: Oral care BID   Follow up Recommendations None      Frequency and Duration            Prognosis        Swallow Study   General HPI: Charles BridgeJoseph Anthony Shuping is a 57 y.o. male with medical history significant of epilepsy and mental retardation. Per chart, pt was admitted to ED 9/15 for right knee and right leg pain and swelling, cellulitis. Per MD, BSE was ordered to assess appropriate diet texture. Type of Study: Bedside Swallow Evaluation Previous Swallow Assessment: (none) Diet Prior to this Study: Dysphagia 3 (soft);Thin liquids Temperature Spikes Noted: No Respiratory Status: Room air History of Recent Intubation: No Behavior/Cognition: Alert;Cooperative;Pleasant mood;Requires cueing;Distractible Oral Cavity Assessment: Within Functional Limits Oral Care Completed by SLP: No Oral Cavity - Dentition: Adequate natural dentition Vision: Functional for self-feeding Self-Feeding Abilities: Able to feed self;Needs assist Patient Positioning: Upright in bed Baseline Vocal Quality: Normal Volitional Cough: Strong Volitional Swallow: Unable to elicit(cognitively unable to elicit)    Oral/Motor/Sensory Function Overall Oral Motor/Sensory Function: Within functional limits   Ice Chips Ice chips: Not tested   Thin Liquid Thin Liquid: Within functional limits Presentation: Cup;Straw    Nectar Thick Nectar Thick Liquid: Not tested   Honey Thick Honey Thick Liquid: Not tested   Puree Puree: Within functional limits   Solid     Solid: Impaired Oral Phase Impairments: (none) Oral Phase Functional Implications: (none) Pharyngeal Phase Impairments: Cough - Immediate     Suzzette RighterErin Brycelynn Stampley, Student SLP   Suzzette RighterErin Basil Blakesley 01/05/2018,4:27 PM

## 2018-01-06 LAB — HEPATIC FUNCTION PANEL
ALK PHOS: 51 U/L (ref 38–126)
ALT: 20 U/L (ref 0–44)
AST: 24 U/L (ref 15–41)
Albumin: 2.3 g/dL — ABNORMAL LOW (ref 3.5–5.0)
BILIRUBIN DIRECT: 0.2 mg/dL (ref 0.0–0.2)
BILIRUBIN INDIRECT: 0.2 mg/dL — AB (ref 0.3–0.9)
BILIRUBIN TOTAL: 0.4 mg/dL (ref 0.3–1.2)
Total Protein: 5.6 g/dL — ABNORMAL LOW (ref 6.5–8.1)

## 2018-01-06 LAB — BASIC METABOLIC PANEL
ANION GAP: 9 (ref 5–15)
ANION GAP: 8 (ref 5–15)
ANION GAP: 8 (ref 5–15)
Anion gap: 10 (ref 5–15)
Anion gap: 10 (ref 5–15)
BUN: 16 mg/dL (ref 6–20)
BUN: 15 mg/dL (ref 6–20)
BUN: 14 mg/dL (ref 6–20)
BUN: 13 mg/dL (ref 6–20)
BUN: 15 mg/dL (ref 6–20)
CALCIUM: 7.9 mg/dL — AB (ref 8.9–10.3)
CALCIUM: 7.8 mg/dL — AB (ref 8.9–10.3)
CHLORIDE: 113 mmol/L — AB (ref 98–111)
CHLORIDE: 115 mmol/L — AB (ref 98–111)
CO2: 25 mmol/L (ref 22–32)
CO2: 22 mmol/L (ref 22–32)
CO2: 23 mmol/L (ref 22–32)
CO2: 26 mmol/L (ref 22–32)
CO2: 21 mmol/L — AB (ref 22–32)
CREATININE: 1.05 mg/dL (ref 0.61–1.24)
Calcium: 8.2 mg/dL — ABNORMAL LOW (ref 8.9–10.3)
Calcium: 8.2 mg/dL — ABNORMAL LOW (ref 8.9–10.3)
Calcium: 8.1 mg/dL — ABNORMAL LOW (ref 8.9–10.3)
Chloride: 120 mmol/L — ABNORMAL HIGH (ref 98–111)
Chloride: 118 mmol/L — ABNORMAL HIGH (ref 98–111)
Chloride: 115 mmol/L — ABNORMAL HIGH (ref 98–111)
Creatinine, Ser: 1.08 mg/dL (ref 0.61–1.24)
Creatinine, Ser: 0.99 mg/dL (ref 0.61–1.24)
Creatinine, Ser: 1.02 mg/dL (ref 0.61–1.24)
Creatinine, Ser: 0.82 mg/dL (ref 0.61–1.24)
GFR calc Af Amer: >60 mL/min (ref >60)
GFR calc Af Amer: >60 mL/min (ref >60)
GFR calc Af Amer: >60 mL/min (ref >60)
GFR calc Af Amer: >60 mL/min (ref >60)
GFR calc non Af Amer: >60 mL/min (ref >60)
GLUCOSE: 126 mg/dL — AB (ref 70–99)
GLUCOSE: 116 mg/dL — AB (ref 70–99)
GLUCOSE: 102 mg/dL — AB (ref 70–99)
GLUCOSE: 147 mg/dL — AB (ref 70–99)
Glucose, Bld: 104 mg/dL — ABNORMAL HIGH (ref 70–99)
POTASSIUM: 3.5 mmol/L (ref 3.5–5.1)
POTASSIUM: 3.3 mmol/L — AB (ref 3.5–5.1)
POTASSIUM: 3.5 mmol/L (ref 3.5–5.1)
Potassium: 3.5 mmol/L (ref 3.5–5.1)
Potassium: 3.6 mmol/L (ref 3.5–5.1)
SODIUM: 150 mmol/L — AB (ref 135–145)
Sodium: 155 mmol/L — ABNORMAL HIGH (ref 135–145)
Sodium: 147 mmol/L — ABNORMAL HIGH (ref 135–145)
Sodium: 147 mmol/L — ABNORMAL HIGH (ref 135–145)
Sodium: 144 mmol/L (ref 135–145)

## 2018-01-06 LAB — CBC
HEMATOCRIT: 39.9 % (ref 39.0–52.0)
Hemoglobin: 11.8 g/dL — ABNORMAL LOW (ref 13.0–17.0)
MCH: 28.2 pg (ref 26.0–34.0)
MCHC: 29.6 g/dL — ABNORMAL LOW (ref 30.0–36.0)
MCV: 95.2 fL (ref 78.0–100.0)
PLATELETS: 230 10*3/uL (ref 150–400)
RBC: 4.19 MIL/uL — AB (ref 4.22–5.81)
RDW: 13.8 % (ref 11.5–15.5)
WBC: 15.6 10*3/uL — AB (ref 4.0–10.5)

## 2018-01-06 MED ORDER — DOXYCYCLINE HYCLATE 100 MG PO TABS: 100.0000 mg | ORAL_TABLET | Freq: Two times a day (BID) | ORAL | Status: DC

## 2018-01-06 MED ORDER — VANCOMYCIN HCL IN DEXTROSE 750-5 MG/150ML-% IV SOLN
750.0000 mg | Freq: Two times a day (BID) | INTRAVENOUS | Status: DC
  Administered 2018-01-06 – 2018-01-07 (×4): 750 mg via INTRAVENOUS
  Filled 2018-01-06 (×5): qty 150

## 2018-01-06 MED ORDER — POTASSIUM CHLORIDE CRYS ER 20 MEQ PO TBCR
40.0000 meq | EXTENDED_RELEASE_TABLET | Freq: Once | ORAL | Status: AC
  Administered 2018-01-06: 40 meq via ORAL
  Filled 2018-01-06: qty 2

## 2018-01-06 MED ORDER — CEPHALEXIN 500 MG PO CAPS: 500.0000 mg | ORAL_CAPSULE | Freq: Two times a day (BID) | ORAL | Status: DC

## 2018-01-06 NOTE — Evaluation (Signed)
Physical Therapy Evaluation Patient Details Name: Charles Rich MRN: [id number] DOB: 1960-11-20 Today's Date: 01/06/2018   History of Present Illness  57yo male brought to the ED by his brother due to R knee pain and edema; per family, [name] sometimes drags himself around on his knees to try to get places. He received aspiration of R knee on 9/16. PMH MR, seizures   Clinical Impression   Patient received in bed, pleasant and willing to participate in skilled PT session today; no family present but patient able to answer simple yes/no questions and have very basic level of conversation with therapist. Attempted supine to sit with MaxA, able to perform part of transfer before patient non-verbally refused further attempt and resisted PT, returned back to bed with MinA. MaxA for rolling R, totalA for rolling left due to physical contractures/stiffness and difficulty with sequencing. B LEs are moderately contracted and based off patterns of physical stiffness, it seems very likely that patient is non-ambulatory at home and likely spends a lot of time in bed on his R side. Session limited by participation and low BP as well. He was left in bed with all needs met, nursing staff present and attending. Family was not present to provide details about PLOF and home equipment. Recommend potential SNF placement moving forward unless family can provide safe 24/7 support/assistance at home; will follow and update recommendations as appropriate.   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  A walker alone will not resolve the issues with performing activities of daily living. A wheelchair will allow patient to safely perform daily activities.  The patient can self propel in the home or has a caregiver who can provide assistance.        Follow Up  Recommendations SNF;Other (comment);Supervision/Assistance - 24 hour(unless family can provide safe 24/7 support/assistance )    Equipment Recommendations  Wheelchair (measurements PT);Wheelchair cushion (measurements PT);Hospital bed    Recommendations for Other Services OT consult     Precautions / Restrictions Precautions Precautions: Fall;Other (comment) Precaution Comments: MR Restrictions Weight Bearing Restrictions: No      Mobility  Bed Mobility Overal bed mobility: Needs Assistance Bed Mobility: Rolling;Supine to Sit;Sit to Supine Rolling: Max assist;Total assist   Supine to sit: Max assist Sit to supine: Min assist   General bed mobility comments: MaxA for rolling to R, totalA for rolling L, difficulty sequencing and very physical tight as if he does not often lay on L side. MaxA for partial supine to sit, then patient began resisting PT and returned himself to bed with MinA   Transfers                 General transfer comment: uanble to attempt/patient non-verbally refusing   Ambulation/Gait             General Gait Details: unable to attempt/unsure of ambulatory status  Stairs            Wheelchair Mobility    Modified Rankin (Stroke Patients Only)       Balance Overall balance assessment: Needs assistance;History of Falls Sitting-balance support: Feet supported;Bilateral upper extremity supported Sitting balance-Leahy Scale: Poor     Standing balance support: During functional activity Standing balance-Leahy Scale: Zero  Pertinent Vitals/Pain Pain Assessment: Faces Faces Pain Scale: Hurts a little bit Pain Location: R knee  Pain Descriptors / Indicators: Discomfort Pain Intervention(s): Limited activity within patient's tolerance;Monitored during session    Home Living Family/patient expects to be discharged to:: Unsure Living Arrangements: Other (Comment)                Additional Comments: unsure- family not present to provide details/clarify     Prior Function Level of Independence: Needs assistance   Gait / Transfers Assistance Needed: unsure, per physical exam and history, likely not ambulatory. Unsure regarding transfer ability. Family not present to clarify.   ADL's / Homemaking Assistance Needed: unsure- family not present to clarify         Hand Dominance        Extremity/Trunk Assessment   Upper Extremity Assessment Upper Extremity Assessment: Defer to OT evaluation    Lower Extremity Assessment Lower Extremity Assessment: Generalized weakness;RLE deficits/detail;LLE deficits/detail RLE Deficits / Details: knee flexion/windswept contracture  RLE Coordination: decreased gross motor LLE Deficits / Details: knee flexion/windswept contracture  LLE Coordination: decreased gross motor    Cervical / Trunk Assessment Cervical / Trunk Assessment: Kyphotic  Communication   Communication: Expressive difficulties;Other (comment)(can answer simple yes/no questions, have very basic level of conversation )  Cognition Arousal/Alertness: Awake/alert Behavior During Therapy: Flat affect Overall Cognitive Status: History of cognitive impairments - at baseline                                 General Comments: MR at baseline, able to answer simple yes/no questions and follow very basic conversation      General Comments      Exercises     Assessment/Plan    PT Assessment Patient needs continued PT services  PT Problem List Decreased strength;Decreased mobility;Decreased safety awareness;Decreased range of motion;Decreased coordination;Decreased activity tolerance;Decreased balance       PT Treatment Interventions Therapeutic activities;DME instruction;Gait training;Therapeutic exercise;Patient/family education;Stair training;Balance training;Functional mobility training;Neuromuscular re-education;Wheelchair mobility training     PT Goals (Current goals can be found in the Care Plan section)  Acute Rehab PT Goals PT Goal Formulation: Patient unable to participate in goal setting    Frequency Min 2X/week   Barriers to discharge        Co-evaluation               AM-PAC PT "6 Clicks" Daily Activity  Outcome Measure Difficulty turning over in bed (including adjusting bedclothes, sheets and blankets)?: Unable Difficulty moving from lying on back to sitting on the side of the bed? : Unable Difficulty sitting down on and standing up from a chair with arms (e.g., wheelchair, bedside commode, etc,.)?: Unable Help needed moving to and from a bed to chair (including a wheelchair)?: Total Help needed walking in hospital room?: Total Help needed climbing 3-5 steps with a railing? : Total 6 Click Score: 6    End of Session   Activity Tolerance: Patient tolerated treatment well Patient left: in bed;with call bell/phone within reach;with nursing/sitter in room Nurse Communication: Mobility status PT Visit Diagnosis: Muscle weakness (generalized) (M62.81);Other abnormalities of gait and mobility (R26.89)    Time: 9811-9147 PT Time Calculation (min) (ACUTE ONLY): 21 min   Charges:   PT Evaluation $PT Eval High Complexity: 1 High         Deniece Ree PT, DPT, CBIS  Supplemental Physical Therapist [place]    Pager [phone]  Acute Rehab Office [phone]    

## 2018-01-06 NOTE — Progress Notes (Signed)
[facility] Progress Note  Patient: Charles Rich ZOX:[id number]   PCP: Kallie LocksStroud, Natalie M, FNP DOB: 11/02/1960   DOA: 01/04/2018   DOS: 01/06/2018   Date of Service: the patient was seen and examined on 01/06/2018  Subjective: No further fever.  Denies any acute complaint.  No acute events overnight as well.  No nausea no vomiting or diarrhea.  Tolerating oral diet.  Brief hospital course: Pt. with PMH of seizures, mental retardation; admitted on 01/04/2018, presented with complaint of fall and right knee swelling, was found to have hypernatremia and cellulitis of the right knee. Currently further plan is continue IV antibiotics.  Assessment and Plan: 1.  Hypernatremia.  Hyperchloremia Dehydration. Likely poor p.o. intake as well. Patient was started on D5, received D5 bolus as well. Later on he was transitioned to half normal saline. I have DC'd the food this morning but will continue to monitor sodium appears to be trending in 147 range today. No focal deficit on examination. Continue to monitor BMP every 6hours. Continue to monitor in the stepdown unit.  2.  Right knee cellulitis, subcutaneous abscess. Staph aureus infection Appreciate orthopedic input. Patient underwent bedside aspiration. Analysis does not suggest any significant leukocytosis.  Although culture is growing staph aureus.  Sensitivity currently pending. For now we will continue with IV vancomycin since MRSA PCR is positive. Orthopedics do not think the patient has septic arthritis.  3.  History of seizures. Resuming home regimen, with Dilantin and Keppra.  Management per pharmacy.  4. Mental retardation. Management per outpatient regimen.  5.  Concern for dysphagia. Speech therapy consulted, patient will be on soft diet.  6.  Mild hypokalemia. Replacing.  Diet: soft diet DVT Prophylaxis: subcutaneous Heparin  Advance goals of care discussion: full code  Family Communication: no family was  present at bedside, at the time of interview.   Disposition:  Discharge to be determined .  PT recommends SNF versus 24-hour supervision.  Consultants: orthopedics  Procedures: Bedside right knee aspiration  Antibiotics: Anti-infectives (From admission, onward)   Start     Dose/Rate Route Frequency Ordered Stop   01/06/18 1030  vancomycin (VANCOCIN) IVPB 750 mg/150 ml premix     750 mg 150 mL/hr over 60 Minutes Intravenous Every 12 hours 01/06/18 1017     01/06/18 1000  doxycycline (VIBRA-TABS) tablet 100 mg  Status:  Discontinued     100 mg Oral Every 12 hours 01/06/18 0842 01/06/18 0845   01/06/18 1000  cephALEXin (KEFLEX) capsule 500 mg  Status:  Discontinued     500 mg Oral Every 12 hours 01/06/18 0842 01/06/18 0845   01/05/18 0800  vancomycin (VANCOCIN) IVPB 750 mg/150 ml premix  Status:  Discontinued     750 mg 150 mL/hr over 60 Minutes Intravenous Every 12 hours 01/04/18 1944 01/06/18 0841   01/04/18 2000  vancomycin (VANCOCIN) IVPB 1000 mg/200 mL premix     1,000 mg 200 mL/hr over 60 Minutes Intravenous  Once 01/04/18 1902 01/05/18 0216   01/04/18 1130  clindamycin (CLEOCIN) IVPB 600 mg     600 mg 100 mL/hr over 30 Minutes Intravenous  Once 01/04/18 1124 01/04/18 1307       Objective: Physical Exam: Vitals:   01/06/18 0834 01/06/18 1011 01/06/18 1131 01/06/18 1500  BP: (!) 97/58 (!) 85/58 92/62 117/71  Pulse: 79 80 84 89  Resp: 18 17 20 20   Temp: 98.2 F (36.8 C) 98.2 F (36.8 C) 98 F (36.7 C) (!) 97.4 F (36.3 C)  TempSrc: Oral Oral Oral Oral  SpO2: 99% 100% 98% 99%  Weight:      Height:        Intake/Output Summary (Last 24 hours) at 01/06/2018 1808 Last data filed at 01/06/2018 1600 Gross per 24 hour  Intake 1250 ml  Output 1100 ml  Net 150 ml   Filed Weights   01/04/18 1937 01/04/18 2145 01/05/18 2047  Weight: 69.9 kg 65.2 kg 65.2 kg   General: Alert, Awake and Oriented to Time and Person. Appear in moderate distress, affect appropriate Eyes:  PERRL, Conjunctiva normal ENT: Oral Mucosa clear dry. Neck: no JVD, no Abnormal Mass Or lumps Cardiovascular: S1 and S2 Present, no Murmur, Peripheral Pulses Present Respiratory: normal respiratory effort, Bilateral Air entry equal and Decreased, no use of accessory muscle, Clear to Auscultation, no Crackles, no wheezes Abdomen: Bowel Sound present, Soft and no tenderness, no hernia Skin: right knee redness, no Rash, no induration Extremities: right knee Pedal edema, no calf tenderness Neurologic: Grossly no focal neuro deficit. Bilaterally Equal motor strength  Data Reviewed: CBC: Recent Labs  Lab 01/04/18 1111 01/06/18 0438  WBC 17.3* 15.6*  NEUTROABS 14.3*  --   HGB 13.4 11.8*  HCT 45.4 39.9  MCV 96.2 95.2  PLT 250 230   Basic Metabolic Panel: Recent Labs  Lab 01/05/18 1716 01/05/18 2248 01/06/18 0438 01/06/18 1312 01/06/18 1619  NA 157* 155* 150* 147* 147*  K 3.2* 3.5 3.6 3.5 3.3*  CL 124* 120* 118* 115* 113*  CO2 23 25 22 23 26   GLUCOSE 113* 126* 104* 116* 102*  BUN 17 16 15 14 13   CREATININE 1.00 1.08 1.05 0.99 1.02  CALCIUM 7.8* 7.9* 7.8* 8.2* 8.2*    Liver Function Tests: Recent Labs  Lab 01/06/18 1619  AST 24  ALT 20  ALKPHOS 51  BILITOT 0.4  PROT 5.6*  ALBUMIN 2.3*   No results for input(s): LIPASE, AMYLASE in the last 168 hours. No results for input(s): AMMONIA in the last 168 hours. Coagulation Profile: Recent Labs  Lab 01/05/18 1015  INR 1.36   Cardiac Enzymes: No results for input(s): CKTOTAL, CKMB, CKMBINDEX, TROPONINI in the last 168 hours. BNP (last 3 results) No results for input(s): PROBNP in the last 8760 hours. CBG: Recent Labs  Lab 01/04/18 1726  GLUCAP 80   Studies: No results found.  Scheduled Meds: . enoxaparin (LOVENOX) injection  40 mg Subcutaneous Daily  . levETIRAcetam  1,000 mg Oral QPM  . levETIRAcetam  500 mg Oral q morning - 10a  . multivitamin with minerals  1 tablet Oral Daily  . phenytoin  100 mg Oral  Daily   And  . phenytoin  200 mg Oral QHS   Continuous Infusions: . vancomycin 750 mg (01/06/18 1100)   PRN Meds: acetaminophen  Time spent: 35 minutes  Author: Lynden Oxford, MD [facility] Pager: [phone] 01/06/2018 6:08 PM  If 7PM-7AM, please contact night-coverage at [email or web address], password [facility]

## 2018-01-07 DIAGNOSIS — E870 Hyperosmolality and hypernatremia: Secondary | ICD-10-CM

## 2018-01-07 DIAGNOSIS — L0291 Cutaneous abscess, unspecified: Secondary | ICD-10-CM

## 2018-01-07 LAB — BASIC METABOLIC PANEL
Anion gap: 9 (ref 5–15)
Anion gap: 11 (ref 5–15)
BUN: 13 mg/dL (ref 6–20)
BUN: 10 mg/dL (ref 6–20)
CHLORIDE: 114 mmol/L — AB (ref 98–111)
CO2: 22 mmol/L (ref 22–32)
CO2: 22 mmol/L (ref 22–32)
CREATININE: 0.95 mg/dL (ref 0.61–1.24)
Calcium: 7.8 mg/dL — ABNORMAL LOW (ref 8.9–10.3)
Calcium: 8.2 mg/dL — ABNORMAL LOW (ref 8.9–10.3)
Chloride: 113 mmol/L — ABNORMAL HIGH (ref 98–111)
Creatinine, Ser: 0.87 mg/dL (ref 0.61–1.24)
GFR calc Af Amer: >60 mL/min (ref >60)
GFR calc non Af Amer: >60 mL/min (ref >60)
Glucose, Bld: 112 mg/dL — ABNORMAL HIGH (ref 70–99)
Glucose, Bld: 203 mg/dL — ABNORMAL HIGH (ref 70–99)
POTASSIUM: 3.8 mmol/L (ref 3.5–5.1)
POTASSIUM: 3.8 mmol/L (ref 3.5–5.1)
SODIUM: 146 mmol/L — AB (ref 135–145)
Sodium: 145 mmol/L (ref 135–145)

## 2018-01-07 LAB — CBC WITH DIFFERENTIAL/PLATELET
Abs Immature Granulocytes: 0.1 10*3/uL (ref 0.0–0.1)
Basophils Absolute: 0.1 10*3/uL (ref 0.0–0.1)
Basophils Relative: 0 %
EOS PCT: 2 %
Eosinophils Absolute: 0.2 10*3/uL (ref 0.0–0.7)
HCT: 36.6 % — ABNORMAL LOW (ref 39.0–52.0)
HEMOGLOBIN: 11.3 g/dL — AB (ref 13.0–17.0)
Immature Granulocytes: 1 %
LYMPHS PCT: 21 %
Lymphs Abs: 2.4 10*3/uL (ref 0.7–4.0)
MCH: 28.2 pg (ref 26.0–34.0)
MCHC: 30.9 g/dL (ref 30.0–36.0)
MCV: 91.3 fL (ref 78.0–100.0)
Monocytes Absolute: 0.5 10*3/uL (ref 0.1–1.0)
Monocytes Relative: 4 %
Neutro Abs: 8.6 10*3/uL — ABNORMAL HIGH (ref 1.7–7.7)
Neutrophils Relative %: 72 %
Platelets: 213 10*3/uL (ref 150–400)
RBC: 4.01 MIL/uL — AB (ref 4.22–5.81)
RDW: 13.1 % (ref 11.5–15.5)
WBC: 11.9 10*3/uL — ABNORMAL HIGH (ref 4.0–10.5)

## 2018-01-07 LAB — GLUCOSE, CAPILLARY: GLUCOSE-CAPILLARY: 112 mg/dL — AB (ref 70–99)

## 2018-01-07 LAB — MAGNESIUM: Magnesium: 2.1 mg/dL (ref 1.7–2.4)

## 2018-01-07 LAB — PHENYTOIN LEVEL, FREE AND TOTAL
PHENYTOIN FREE: NOT DETECTED ug/mL (ref 1.0–2.0)
Phenytoin, Total: 2.1 ug/mL — ABNORMAL LOW (ref 10.0–20.0)

## 2018-01-07 MED ORDER — LACTATED RINGERS IV BOLUS
500.0000 mL | Freq: Once | INTRAVENOUS | Status: AC
  Administered 2018-01-07: 500 mL via INTRAVENOUS

## 2018-01-07 NOTE — Progress Notes (Signed)
Pharmacy Antibiotic Note  Charles Rich is a 57 y.o. male admitted on 01/04/2018 with right knee swelling.  Pharmacy has been consulted for vancomycin dosing for right knee cellulitis and subcutaneous abscess s/p aspiration.  Abscess culture is growing MRSA. Renal function is stable. Likely change to PO abx soon so will hold off on trough.  Plan: Vancomycin 750 mg IV every 12 hours.  Goal trough 10-15 mcg/mL.  Monitor renal function, cx results, clinical progress, and VT as appropriate Recommend Bactrim DS PO bid on discharge with close electrolyte monitoring  Height: 6' (182.9 cm) Weight: 152 lb 5.4 oz (69.1 kg) IBW/kg (Calculated) : 77.6  Temp (24hrs), Avg:97.9 F (36.6 C), Min:97.4 F (36.3 C), Max:98.8 F (37.1 C)  Recent Labs  Lab 01/04/18 1111  01/06/18 0438 01/06/18 1312 01/06/18 1619 01/06/18 2137 01/07/18 0424 01/07/18 0939  WBC 17.3*  --  15.6*  --   --   --  11.9*  --   CREATININE  --    < > 1.05 0.99 1.02 0.82 0.87 0.95   < > = values in this interval not displayed.    Estimated Creatinine Clearance: 84.9 mL/min (by C-G formula based on SCr of 0.95 mg/dL).    No Known Allergies  Antimicrobials this admission: Vancomycin 9/15 >>  Clindamycin 9/15 x1  Dose adjustments this admission:   Microbiology results: 9/16 synovial right knee: ngtd 9/16 abscess: abundant MRSA (S-cipro, clinda, Bactrim) 9/16 MRSA PCR +   Thank you for allowing pharmacy to be a part of this patient's care.  Loura BackJennifer Lena, PharmD, BCPS Clinical Pharmacist Clinical phone for 01/07/2018 until 3p is [phone] Please check AMION for all Pharmacist numbers by unit 01/07/2018 11:41 AM

## 2018-01-07 NOTE — Care Management Note (Addendum)
Case Management Note  Patient Details  Name: Charles Rich MRN: [id number] Date of Birth: 12/01/1960  Subjective/Objective:  NCM spoke with patient brother Charles Rich, he [place] niece [place] and that he would be the one we need to speak with concerning patient.  He states patient has 24 hr care at home and [facility], [facility], HHPT, HHOT, HHAIDE and Social work.  [facility], soc will begin 24-48 hrs post dc.  Patient has BSC and a w/chair at home.  Address he will be going to is [address] [place] [id number].  He will need ambulance transport at discharge per brother.                   Action/Plan: DC home when ready.   Expected Discharge Date:                  Expected Discharge Plan:  Home w Home Health Services  In-House Referral:     Discharge planning Services  CM Consult  Post Acute Care Choice:  Home Health Choice offered to:  Sibling  DME Arranged:    DME Agency:     HH Arranged:  RN, PT, OT, Nurse's Aide, Social Work [facility]  [facility]  Status of Service:  In process, will continue to follow  If discussed at Long Length of Stay Meetings, dates discussed:    Additional Comments:  [name], [name], RN 01/07/2018, 12:41 PM

## 2018-01-07 NOTE — Care Management Important Message (Signed)
Important Message  Patient Details  Name: Charles Rich MRN: [id number] Date of Birth: 03/25/1961   Medicare Important Message Given:  Yes    [name], Oluwatamilore Starnes [place], RN 01/07/2018, 12:29 PM

## 2018-01-07 NOTE — Evaluation (Signed)
Occupational Therapy Evaluation Patient Details Name: Charles Rich MRN: [id number] DOB: May 03, 1960 Today's Date: 01/07/2018    History of Present Illness 57yo male brought to the ED by his brother due to R knee pain and edema; per family, [name] sometimes drags himself around on his knees to try to get places. He received aspiration of R knee on 9/16. PMH MR, seizures    Clinical Impression   Upon arrival, pt was supine in bed and watching TV. Pt able to answer yes/no question and reports he received assistance for ADLs and transfer to w/c. However, due to cognitive baseline unsure of PLOF and home support. Pt currently performing self feeding at bed level and required Max A for bathing, dressing, and toileting at bed level. Pt sitting at EOB for ~2 minutes with Max A+2 before requesting to lay back in bed. Pt would benefit from further acute OT to facilitate safe dc. Recommend dc to SNF for further OT to optimize safety, independence with ADLs, and return to PLOF. However, if family able to provide 24/7 support and this is baseline function, recommend dc home with HHOT.     Follow Up Recommendations  SNF;Supervision/Assistance - 24 hour(Unsure of PLOF and home support; may be at baseline)    Equipment Recommendations  Other (comment)(Need to confirm home set up and DME/AE)    Recommendations for Other Services PT consult     Precautions / Restrictions Precautions Precautions: Fall;Other (comment) Precaution Comments: 57yo Restrictions Weight Bearing Restrictions: No      Mobility Bed Mobility Overal bed mobility: Needs Assistance Bed Mobility: Rolling;Sidelying to Sit;Sit to Sidelying Rolling: Min assist   Supine to sit: Max assist;+2 for physical assistance   Sit to sidelying: Min assist;+2 for physical assistance General bed mobility comments: Pt able to roll into his right side and bring his BLEs off EOB. Pt requiring Max A +2 for pushing up into sitting.  However, pt wanting to lay back down and demonstrated good return to sidelying in bed. Providing pillows in bed to provent sores.   Transfers                 General transfer comment: Pt wanting to return back to bed    Balance Overall balance assessment: Needs assistance;History of Falls Sitting-balance support: Feet supported;Bilateral upper extremity supported Sitting balance-Leahy Scale: Poor                                     ADL either performed or assessed with clinical judgement   ADL Overall ADL's : Needs assistance/impaired Eating/Feeding: Set up;Supervision/ safety;Bed level Eating/Feeding Details (indicate cue type and reason): Pt able to bring cup to his mouth and drink with supervision. Noting he spills some of his drink. Bed level Grooming: Set up;Supervision/safety;Bed level   Upper Body Bathing: Moderate assistance;Bed level   Lower Body Bathing: Maximal assistance;Bed level   Upper Body Dressing : Moderate assistance;Bed level   Lower Body Dressing: Maximal assistance;Bed level     Toilet Transfer Details (indicate cue type and reason): RN reports he has been having BM in bed and requires Max-Total A for toilet hygiene   Toileting - Clothing Manipulation Details (indicate cue type and reason): RN reports he has been having BM in bed and requires Max-Total A for toilet hygiene       General ADL Comments: Pt stating "I have a cold" after coughing; noting wet  cough sound. Pt very agreeable. however, not sitting at EOB for very long and pulling himself back into sidelying     Vision         Perception     Praxis      Pertinent Vitals/Pain Pain Assessment: Faces Faces Pain Scale: Hurts a little bit Pain Location: R knee  Pain Descriptors / Indicators: Discomfort Pain Intervention(s): Monitored during session;Repositioned     Hand Dominance     Extremity/Trunk Assessment Upper Extremity Assessment Upper Extremity Assessment:  Generalized weakness   Lower Extremity Assessment Lower Extremity Assessment: Defer to PT evaluation RLE Deficits / Details: knee flexion/windswept contracture  RLE Coordination: decreased gross motor LLE Deficits / Details: knee flexion/windswept contracture  LLE Coordination: decreased gross motor   Cervical / Trunk Assessment Cervical / Trunk Assessment: Kyphotic   Communication Communication Communication: Expressive difficulties;Other (comment)(can answer simple yes/no questions, have very basic level of conversation )   Cognition Arousal/Alertness: Awake/alert Behavior During Therapy: Flat affect Overall Cognitive Status: History of cognitive impairments - at baseline                                 General Comments: Developmental delay (MR per chart) at baseline, able to answer simple yes/no questions and follow very basic conversation. Pt stating "thank you" throughout session   General Comments  BP soft but consistant throughout session    Exercises     Shoulder Instructions      Home Living Family/patient expects to be discharged to:: Unsure Living Arrangements: Other (Comment)                               Additional Comments: unsure- family not present to provide details/clarify       Prior Functioning/Environment Level of Independence: Needs assistance  Gait / Transfers Assistance Needed: unsure, per physical exam and history, likely not ambulatory. Unsure regarding transfer ability. Family not present to clarify. Pt reporting that family assisted him in transfering to w/c but unsure of relability. ADL's / Homemaking Assistance Needed: unsure- family not present to clarify             OT Problem List: Decreased strength;Decreased range of motion;Impaired balance (sitting and/or standing);Decreased activity tolerance;Decreased cognition;Decreased safety awareness;Decreased knowledge of use of DME or AE;Decreased knowledge of  precautions      OT Treatment/Interventions: Self-care/ADL training;Therapeutic exercise;Energy conservation;DME and/or AE instruction;Therapeutic activities;Patient/family education    OT Goals(Current goals can be found in the care plan section) Acute Rehab OT Goals Patient Stated Goal: Unstated OT Goal Formulation: Patient unable to participate in goal setting Time For Goal Achievement: 01/21/18 Potential to Achieve Goals: Good ADL Goals Pt Will Perform Grooming: with min assist;sitting(supported sitting) Pt Will Perform Upper Body Bathing: with min assist;sitting(supported sitting) Pt Will Transfer to Toilet: with mod assist;stand pivot transfer;bedside commode Additional ADL Goal #1: Pt will perform bed mobility with Min A in preparation for ADLs  OT Frequency: Min 2X/week   Barriers to D/C: Other (comment)  Unsure of home support and PLOF       Co-evaluation              AM-PAC PT "6 Clicks" Daily Activity     Outcome Measure Help from another person eating meals?: A Little Help from another person taking care of personal grooming?: A Little Help from another person toileting, which includes using  toliet, bedpan, or urinal?: A Lot Help from another person bathing (including washing, rinsing, drying)?: A Lot Help from another person to put on and taking off regular upper body clothing?: A Lot Help from another person to put on and taking off regular lower body clothing?: A Lot 6 Click Score: 14   End of Session Nurse Communication: Mobility status  Activity Tolerance: Patient tolerated treatment well;Other (comment)(Wanting to lay back in bed) Patient left: in bed;with call bell/phone within reach;with bed alarm set  OT Visit Diagnosis: Unsteadiness on feet (R26.81);Other abnormalities of gait and mobility (R26.89);Muscle weakness (generalized) (M62.81);Other symptoms and signs involving cognitive function                Time: [id number] OT Time Calculation (min):  13 min Charges:  OT General Charges $OT Visit: 1 Visit OT Evaluation $OT Eval Moderate Complexity: 1 Mod  Areon Cocuzza MSOT, OTR/L Acute Rehab Pager: [phone] Office: [phone]  Theodoro GristCharis M Laurabelle Gorczyca 01/07/2018, 5:12 PM

## 2018-01-07 NOTE — Progress Notes (Signed)
[facility] Progress Note  Patient: Charles Rich ZOX:[id number]   PCP: Kallie Locks, FNP DOB: [birth year]   DOA: 01/04/2018   DOS: 01/07/2018   Date of Service: the patient was seen and examined on 01/07/2018  Subjective: No further fever.  Denies any acute complaint.  No acute events overnight as well.  No nausea no vomiting or diarrhea.  Tolerating oral diet.  Brief hospital course: Pt. with PMH of seizures, mental retardation; admitted on 01/04/2018, presented with complaint of fall and right knee swelling, was found to have hypernatremia and cellulitis of the right knee. Currently further plan is continue IV antibiotics.  Assessment and Plan:  1.  Dehydration causing Hypernatremia.  Hyperchloremia -Na improved, stop IVF -will need workup for DI if Hypernatremia continues  2.  MRSA Right knee cellulitis, subcutaneous abscess. -orthopedics consulted,no evidence of septic arthritis infection and superficial abscess suspected -Underwent bedside aspiration, culture grew MRSA -Continue IV vancomycin, transition to oral antibiotics likely in 48 hours if continues to improve clinically  3.  History of seizures. -stable, continue home regimen of Dilantin and Keppra  4. Mental retardation. Management per outpatient regimen.  5.  Concern for dysphagia. Speech therapy consulted, patient will be on soft diet.  6.  Mild hypokalemia. Replacing.  Diet: soft diet DVT Prophylaxis: subcutaneous Heparin  Advance goals of care discussion: full code  Family Communication: no family was present at bedside  Disposition: SNF in 48hours if stable  Consultants: orthopedics  Procedures: Bedside right knee aspiration  Antibiotics: Anti-infectives (From admission, onward)   Start     Dose/Rate Route Frequency Ordered Stop   01/06/18 1030  vancomycin (VANCOCIN) IVPB 750 mg/150 ml premix     750 mg 150 mL/hr over 60 Minutes Intravenous Every 12 hours 01/06/18 1017     01/06/18  1000  doxycycline (VIBRA-TABS) tablet 100 mg  Status:  Discontinued     100 mg Oral Every 12 hours 01/06/18 0842 01/06/18 0845   01/06/18 1000  cephALEXin (KEFLEX) capsule 500 mg  Status:  Discontinued     500 mg Oral Every 12 hours 01/06/18 0842 01/06/18 0845   01/05/18 0800  vancomycin (VANCOCIN) IVPB 750 mg/150 ml premix  Status:  Discontinued     750 mg 150 mL/hr over 60 Minutes Intravenous Every 12 hours 01/04/18 1944 01/06/18 0841   01/04/18 2000  vancomycin (VANCOCIN) IVPB 1000 mg/200 mL premix     1,000 mg 200 mL/hr over 60 Minutes Intravenous  Once 01/04/18 1902 01/05/18 0216   01/04/18 1130  clindamycin (CLEOCIN) IVPB 600 mg     600 mg 100 mL/hr over 30 Minutes Intravenous  Once 01/04/18 1124 01/04/18 1307       Objective: Physical Exam: Vitals:   01/07/18 0500 01/07/18 0621 01/07/18 0720 01/07/18 1138  BP:  91/63 (!) 81/68 (!) 89/57  Pulse:  78 78 (!) 101  Resp:  17 17 18   Temp:   97.6 F (36.4 C) 98.8 F (37.1 C)  TempSrc:   Oral Axillary  SpO2:  94% 93% 95%  Weight: 69.1 kg     Height:        Intake/Output Summary (Last 24 hours) at 01/07/2018 1220 Last data filed at 01/07/2018 1100 Gross per 24 hour  Intake 770 ml  Output 3550 ml  Net -2780 ml   Filed Weights   01/05/18 2047 01/06/18 2000 01/07/18 0500  Weight: 65.2 kg 69.1 kg 69.1 kg   Gen: Awake, Alert, cognitive delay noted, oriented to place  and person HEENT: PERRLA, Neck supple, no JVD Lungs: Good air movement bilaterally, CTAB CVS: RRR,No Gallops,Rubs or new Murmurs Abd: soft, Non tender, non distended, BS present Extremities: right infrapatellar region with small superficial abscess with purulent discharge Skin: as above  Data Reviewed: CBC: Recent Labs  Lab 01/04/18 1111 01/06/18 0438 01/07/18 0424  WBC 17.3* 15.6* 11.9*  NEUTROABS 14.3*  --  8.6*  HGB 13.4 11.8* 11.3*  HCT 45.4 39.9 36.6*  MCV 96.2 95.2 91.3  PLT 250 230 213   Basic Metabolic Panel: Recent Labs  Lab  01/06/18 1312 01/06/18 1619 01/06/18 2137 01/07/18 0424 01/07/18 0939  NA 147* 147* 144 145 146*  K 3.5 3.3* 3.5 3.8 3.8  CL 115* 113* 115* 114* 113*  CO2 23 26 21* 22 22  GLUCOSE 116* 102* 147* 112* 203*  BUN 14 13 15 13 10   CREATININE 0.99 1.02 0.82 0.87 0.95  CALCIUM 8.2* 8.2* 8.1* 7.8* 8.2*  MG  --   --   --  2.1  --     Liver Function Tests: Recent Labs  Lab 01/06/18 1619  AST 24  ALT 20  ALKPHOS 51  BILITOT 0.4  PROT 5.6*  ALBUMIN 2.3*   No results for input(s): LIPASE, AMYLASE in the last 168 hours. No results for input(s): AMMONIA in the last 168 hours. Coagulation Profile: Recent Labs  Lab 01/05/18 1015  INR 1.36   Cardiac Enzymes: No results for input(s): CKTOTAL, CKMB, CKMBINDEX, TROPONINI in the last 168 hours. BNP (last 3 results) No results for input(s): PROBNP in the last 8760 hours. CBG: Recent Labs  Lab 01/04/18 1726 01/07/18 0157  GLUCAP 80 112*   Studies: No results found.  Scheduled Meds: . enoxaparin (LOVENOX) injection  40 mg Subcutaneous Daily  . levETIRAcetam  1,000 mg Oral QPM  . levETIRAcetam  500 mg Oral q morning - 10a  . multivitamin with minerals  1 tablet Oral Daily  . phenytoin  100 mg Oral Daily   And  . phenytoin  200 mg Oral QHS   Continuous Infusions: . vancomycin 750 mg (01/07/18 0832)   PRN Meds: acetaminophen  Time spent: 35 minutes  Author: Zannie CovePreetha Khole [facility] Page via [email or web address] 01/07/2018 12:20 PM  If 7PM-7AM, please contact night-coverage at [email or web address], password [email or web address]

## 2018-01-07 NOTE — Clinical Social Work Note (Signed)
Per RNCM pt will d/c with Home Health. Clinical Social Worker will sign off for now as social work intervention is no longer needed. Please consult us again if new need arises.   Clarisse GougeBridget A Starlynn Klinkner 01/07/2018

## 2018-01-08 DIAGNOSIS — L02415 Cutaneous abscess of right lower limb: Secondary | ICD-10-CM

## 2018-01-08 LAB — CBC
HCT: 36.2 % — ABNORMAL LOW (ref 39.0–52.0)
HEMOGLOBIN: 11.3 g/dL — AB (ref 13.0–17.0)
MCH: 28.3 pg (ref 26.0–34.0)
MCHC: 31.2 g/dL (ref 30.0–36.0)
MCV: 90.5 fL (ref 78.0–100.0)
PLATELETS: 210 10*3/uL (ref 150–400)
RBC: 4 MIL/uL — ABNORMAL LOW (ref 4.22–5.81)
RDW: 12.6 % (ref 11.5–15.5)
WBC: 12.7 10*3/uL — ABNORMAL HIGH (ref 4.0–10.5)

## 2018-01-08 LAB — BASIC METABOLIC PANEL
Anion gap: 8 (ref 5–15)
BUN: 10 mg/dL (ref 6–20)
CALCIUM: 7.9 mg/dL — AB (ref 8.9–10.3)
CHLORIDE: 109 mmol/L (ref 98–111)
CO2: 24 mmol/L (ref 22–32)
Creatinine, Ser: 0.78 mg/dL (ref 0.61–1.24)
GFR calc Af Amer: >60 mL/min (ref >60)
GFR calc non Af Amer: >60 mL/min (ref >60)
Glucose, Bld: 115 mg/dL — ABNORMAL HIGH (ref 70–99)
Potassium: 3.8 mmol/L (ref 3.5–5.1)
SODIUM: 141 mmol/L (ref 135–145)

## 2018-01-08 LAB — BODY FLUID CULTURE: Culture: NO GROWTH

## 2018-01-08 MED ORDER — DOXYCYCLINE HYCLATE 100 MG PO TABS: 100.0000 mg | ORAL_TABLET | Freq: Two times a day (BID) | ORAL | 0 refills | Status: AC

## 2018-01-08 MED ORDER — ACETAMINOPHEN 325 MG PO TABS: 650.0000 mg | ORAL_TABLET | Freq: Four times a day (QID) | ORAL | Status: AC | PRN

## 2018-01-08 MED FILL — DOXYCYCLINE HYC 100 MG TAB: 100 | 6 days supply | Qty: 12 | Fill #0

## 2018-01-08 NOTE — Care Management Note (Signed)
Case Management Note  Patient Details  Name: Charles Rich MRN: [id number] Date of Birth: 02/16/1961  Subjective/Objective:  NCM spoke with patient brother Charles Rich, he [place] and that he would be the one we need to speak with concerning patient.  He states patient has 24 hr care at home and they want him [facility], [facility].  [facility], soc will begin 24-48 hrs post dc.  Patient has BSC and a w/chair at home.  Address he will be going to is [address] [place] [id number].  He will need ambulance transport at discharge per brother.    9/19 Letha Capeeborah Janiesha Diehl RN, BSN - NCM called [facility] for transport , notified [facility]ashDonna [facility] also.                                   [facility]Plan[facility]  Expected Discharge Date:  01/08/18               Expected Discharge Plan:  Home w Home Health Services  In-House Referral:     Discharge planning Services  CM Consult  Post Acute Care Choice:  Home Health Choice offered to:  Sibling  DME Arranged:    DME Agency:     HH Arranged:  PT, OT HH Agency:  [facility]  Status of Service:  Completed, signed off  If discussed at Long Length of Stay Meetings, dates discussed:    Additional Comments:  Charles Rich, Charles Mesch Clinton, RN 01/08/2018, 12:46 PM

## 2018-01-08 NOTE — Progress Notes (Signed)
Occupational Therapy Treatment Patient Details Name: Charles Rich MRN: [id number] DOB: 04/27/1960 Today's Date: 01/08/2018    History of present illness 56yo male brought to the ED by his brother due to R knee pain and edema; per family, Mr. Anda KraftRivers sometimes drags himself around on his knees to try to get places. He received aspiration of R knee on 9/16. PMH MR, seizures    OT comments  Pt participated in ADL retraining session to include UB bathing, dressing and grooming sitting up bed level. Pt declined any repositioning in bed or sitting at EOB, tucking his legs up under himself when asked & attempting to pull blankets back up, he required assistance with this. Per MD, pt may d/c home with family assist later today if able. No family present for PLOF this session, ?if pt is at or near baseline level.    Follow Up Recommendations  SNF;Supervision/Assistance - 24 hour(Unsure of PLOF & home support. May be at baseline, however no family present)    Equipment Recommendations  Other (comment)(Need to confirm home set-up and available DME)    Recommendations for Other Services      Precautions / Restrictions Precautions Precautions: Fall;Other (comment) Precaution Comments: MR Restrictions Weight Bearing Restrictions: No       Mobility Bed Mobility Overal bed mobility: Needs Assistance Bed Mobility: Rolling Rolling: Min assist         General bed mobility comments: Pt declined sitting EOB or repositioning when asked and attempted this morning during ADL retraining session (Pt was noted to tuck his legs up under himself and pull his bed covers back onto himself..  Transfers Overall transfer level: (See note above. Clinically Anticipate +2 physical assist for SPT, though not performed this session )                    Balance Overall balance assessment: Needs assistance;History of Falls                                         ADL either  performed or assessed with clinical judgement   ADL Overall ADL's : Needs assistance/impaired Eating/Feeding: Set up;Supervision/ safety;Bed level Eating/Feeding Details (indicate cue type and reason): Pt able to bring cup to his mouth and drink. Noting he spilled some of his drink, wiped his face with vc's and set-up. Bed level Grooming: Set up;Supervision/safety;Bed level;Wash/dry hands;Wash/dry face Grooming Details (indicate cue type and reason): VC's to wash whole face and not just chin Upper Body Bathing: Moderate assistance;Bed level Upper Body Bathing Details (indicate cue type and reason): VC's & Mod A to bath under arms    Lower Body Bathing Details (indicate cue type and reason): Not performed this session but max A required Upper Body Dressing : Moderate assistance;Bed level Upper Body Dressing Details (indicate cue type and reason): To don/doff gown after bathing UB                   General ADL Comments: Pt participated in ADL retraining session to include UB bathing, dressing and grooming sitting up bed level. Pt declined any repositioning in bed or sitting at EOB, tucking his legs up under himself when asked. Pt was covered back up with blankets and room temperature was increased as pt was exhibiting signs of being cold and with cold hands. Per MD, pt may d/c home with family assist later today  if able. No family present for PLOF this session, ?if pt is at or near baseline level.      Vision       Perception     Praxis      Cognition Arousal/Alertness: Awake/alert Behavior During Therapy: Flat affect Overall Cognitive Status: History of cognitive impairments - at baseline                                 General Comments: Developmental delay (MR per chart) at baseline, able to answer simple yes/no questions and follow very basic conversation. Pt stating "thank you" throughout session        Exercises     Shoulder Instructions       General  Comments      Pertinent Vitals/ Pain       Pain Assessment: No/denies pain Pain Score: 0-No pain Faces Pain Scale: No hurt  Home Living                                          Prior Functioning/Environment              Frequency  Min 2X/week        Progress Toward Goals  OT Goals(current goals can now be found in the care plan section)  Progress towards OT goals: Progressing toward goals  Acute Rehab OT Goals Patient Stated Goal: Unable to state; No family present  Plan Discharge plan remains appropriate    Co-evaluation                 AM-PAC PT "6 Clicks" Daily Activity     Outcome Measure   Help from another person eating meals?: A Little Help from another person taking care of personal grooming?: A Little Help from another person toileting, which includes using toliet, bedpan, or urinal?: A Lot Help from another person bathing (including washing, rinsing, drying)?: A Lot Help from another person to put on and taking off regular upper body clothing?: A Lot Help from another person to put on and taking off regular lower body clothing?: A Lot 6 Click Score: 14    End of Session    OT Visit Diagnosis: Unsteadiness on feet (R26.81);Other abnormalities of gait and mobility (R26.89);Muscle weakness (generalized) (M62.81);Other symptoms and signs involving cognitive function   Activity Tolerance Patient tolerated treatment well;Other (comment)(Wanting to stay in bed, declined sitting EOB and repositioning in bed as well.)   Patient Left in bed;with call bell/phone within reach;with bed alarm set   Nurse Communication Mobility status;Other (comment)(ADL's performed)        Time: 1610-9604 OT Time Calculation (min): 16 min  Charges: OT General Charges $OT Visit: 1 Visit OT Treatments $Self Care/Home Management : 8-22 mins    Walsie Smeltz Beth Dixon, OTR/L 01/08/2018, 8:36 AM

## 2018-01-10 ENCOUNTER — Emergency Department (HOSPITAL_COMMUNITY)
Admission: EM | Admit: 2018-01-10 | Discharge: 2018-01-20 | Disposition: E | Payer: Medicare Other | Attending: Emergency Medicine | Admitting: Emergency Medicine

## 2018-01-10 ENCOUNTER — Encounter (HOSPITAL_COMMUNITY): Payer: Self-pay

## 2018-01-10 ENCOUNTER — Emergency Department (HOSPITAL_COMMUNITY): Payer: Medicare Other

## 2018-01-10 DIAGNOSIS — I213 ST elevation (STEMI) myocardial infarction of unspecified site: Secondary | ICD-10-CM

## 2018-01-10 DIAGNOSIS — I229 Subsequent ST elevation (STEMI) myocardial infarction of unspecified site: Secondary | ICD-10-CM | POA: Diagnosis not present

## 2018-01-10 DIAGNOSIS — I469 Cardiac arrest, cause unspecified: Secondary | ICD-10-CM | POA: Insufficient documentation

## 2018-01-10 DIAGNOSIS — J9600 Acute respiratory failure, unspecified whether with hypoxia or hypercapnia: Secondary | ICD-10-CM | POA: Insufficient documentation

## 2018-01-10 LAB — AEROBIC/ANAEROBIC CULTURE W GRAM STAIN (SURGICAL/DEEP WOUND)

## 2018-01-10 LAB — COMPREHENSIVE METABOLIC PANEL
ALBUMIN: 2.1 g/dL — AB (ref 3.5–5.0)
ALK PHOS: 122 U/L (ref 38–126)
ALT: 299 U/L — AB (ref 0–44)
Anion gap: 27 — ABNORMAL HIGH (ref 5–15)
BILIRUBIN TOTAL: 0.6 mg/dL (ref 0.3–1.2)
BUN: 16 mg/dL (ref 6–20)
CO2: 15 mmol/L — ABNORMAL LOW (ref 22–32)
CREATININE: 1.39 mg/dL — AB (ref 0.61–1.24)
Calcium: 8.1 mg/dL — ABNORMAL LOW (ref 8.9–10.3)
Chloride: 109 mmol/L (ref 98–111)
GFR calc Af Amer: >60 mL/min (ref >60)
GFR, EST NON AFRICAN AMERICAN: 55 mL/min — AB (ref >60)
Glucose, Bld: 193 mg/dL — ABNORMAL HIGH (ref 70–99)
POTASSIUM: 4.7 mmol/L (ref 3.5–5.1)
Sodium: 151 mmol/L — ABNORMAL HIGH (ref 135–145)
TOTAL PROTEIN: 5.2 g/dL — AB (ref 6.5–8.1)

## 2018-01-10 LAB — CBC WITH DIFFERENTIAL/PLATELET
BAND NEUTROPHILS: 0 %
BASOS ABS: 0 10*3/uL (ref 0.0–0.1)
BASOS ABS: ABNORMAL 10*3/uL (ref 0.0–0.1)
BASOS PCT: 0 %
BASOS PCT: ABNORMAL %
Blasts: 0 %
EOS ABS: 0.3 10*3/uL (ref 0.0–0.7)
Eosinophils Absolute: ABNORMAL 10*3/uL (ref 0.0–0.7)
Eosinophils Relative: ABNORMAL %
Eosinophils Relative: 2 %
HCT: 42.1 % (ref 39.0–52.0)
HEMATOCRIT: 42.5 % (ref 39.0–52.0)
HEMOGLOBIN: 11.5 g/dL — AB (ref 13.0–17.0)
Hemoglobin: 11.7 g/dL — ABNORMAL LOW (ref 13.0–17.0)
LYMPHS PCT: 37 %
LYMPHS PCT: ABNORMAL %
Lymphs Abs: ABNORMAL 10*3/uL (ref 0.7–4.0)
Lymphs Abs: 6 10*3/uL — ABNORMAL HIGH (ref 0.7–4.0)
MCH: 28 pg (ref 26.0–34.0)
MCH: 28.6 pg (ref 26.0–34.0)
MCHC: 27.8 g/dL — AB (ref 30.0–36.0)
MCHC: 27.1 g/dL — ABNORMAL LOW (ref 30.0–36.0)
MCV: 103.4 fL — AB (ref 78.0–100.0)
MCV: 102.9 fL — ABNORMAL HIGH (ref 78.0–100.0)
MONO ABS: 0.5 10*3/uL (ref 0.1–1.0)
MONO ABS: ABNORMAL 10*3/uL (ref 0.1–1.0)
MONOS PCT: 3 %
Metamyelocytes Relative: 0 %
Monocytes Relative: ABNORMAL %
Myelocytes: 0 %
NEUTROS ABS: 9.5 10*3/uL — AB (ref 1.7–7.7)
NEUTROS ABS: ABNORMAL 10*3/uL (ref 1.7–7.7)
NEUTROS PCT: ABNORMAL %
Neutrophils Relative %: 58 %
OTHER: 0 %
PLATELETS: 179 10*3/uL (ref 150–400)
Platelets: 173 10*3/uL (ref 150–400)
Promyelocytes Relative: 0 %
RBC: 4.11 MIL/uL — AB (ref 4.22–5.81)
RBC: 4.09 MIL/uL — ABNORMAL LOW (ref 4.22–5.81)
RDW: 12.5 % (ref 11.5–15.5)
RDW: 12.5 % (ref 11.5–15.5)
WBC: 16.6 10*3/uL — AB (ref 4.0–10.5)
WBC: 16.3 10*3/uL — ABNORMAL HIGH (ref 4.0–10.5)
nRBC: 0 /100 WBC

## 2018-01-10 LAB — I-STAT VENOUS BLOOD GAS, ED
Acid-base deficit: 16 mmol/L — ABNORMAL HIGH (ref 0.0–2.0)
Bicarbonate: 16.3 mmol/L — ABNORMAL LOW (ref 20.0–28.0)
O2 SAT: 40 %
PCO2 VEN: 75.2 mmHg — AB (ref 44.0–60.0)
PO2 VEN: 37 mmHg (ref 32.0–45.0)
TCO2: 19 mmol/L — AB (ref 22–32)
pH, Ven: 6.944 — CL (ref 7.250–7.430)

## 2018-01-10 LAB — I-STAT CHEM 8, ED
BUN: 20 mg/dL (ref 6–20)
CHLORIDE: 112 mmol/L — AB (ref 98–111)
Calcium, Ion: 1.02 mmol/L — ABNORMAL LOW (ref 1.15–1.40)
Creatinine, Ser: 1.2 mg/dL (ref 0.61–1.24)
Glucose, Bld: 176 mg/dL — ABNORMAL HIGH (ref 70–99)
HEMATOCRIT: 35 % — AB (ref 39.0–52.0)
Hemoglobin: 11.9 g/dL — ABNORMAL LOW (ref 13.0–17.0)
POTASSIUM: 4.5 mmol/L (ref 3.5–5.1)
SODIUM: 149 mmol/L — AB (ref 135–145)
TCO2: 20 mmol/L — ABNORMAL LOW (ref 22–32)

## 2018-01-10 LAB — I-STAT CG4 LACTIC ACID, ED: Lactic Acid, Venous: >17 mmol/L (ref 0.5–1.9)

## 2018-01-10 LAB — AEROBIC/ANAEROBIC CULTURE (SURGICAL/DEEP WOUND)

## 2018-01-10 LAB — CBG MONITORING, ED: Glucose-Capillary: 145 mg/dL — ABNORMAL HIGH (ref 70–99)

## 2018-01-10 LAB — I-STAT TROPONIN, ED: TROPONIN I, POC: 0.09 ng/mL — AB (ref 0.00–0.08)

## 2018-01-10 LAB — TROPONIN I: TROPONIN I: 0.06 ng/mL — AB (ref <0.03)

## 2018-01-10 LAB — PHENYTOIN LEVEL, TOTAL

## 2018-01-10 MED ORDER — EPINEPHRINE PF 1 MG/10ML IJ SOSY
PREFILLED_SYRINGE | INTRAMUSCULAR | Status: AC | PRN
  Administered 2018-01-10 (×5): 1 mg via INTRAVENOUS

## 2018-01-10 MED ORDER — CALCIUM CHLORIDE 10 % IV SOLN
INTRAVENOUS | Status: AC | PRN
  Administered 2018-01-10: 1 g via INTRAVENOUS

## 2018-01-10 MED ORDER — SODIUM BICARBONATE 8.4 % IV SOLN
INTRAVENOUS | Status: AC | PRN
  Administered 2018-01-10 (×2): 50 meq via INTRAVENOUS

## 2018-01-10 MED ORDER — ATROPINE SULFATE 1 MG/ML IJ SOLN
INTRAMUSCULAR | Status: AC | PRN
  Administered 2018-01-10: 1 mg via INTRAVENOUS

## 2018-01-10 MED ORDER — EPINEPHRINE PF 1 MG/ML IJ SOLN
0.5000 ug/min | INTRAVENOUS | Status: DC
  Filled 2018-01-10: qty 4

## 2018-01-12 MED FILL — Medication: Qty: 1 | Status: AC

## 2018-01-14 ENCOUNTER — Telehealth: Payer: Self-pay | Admitting: Family Medicine

## 2018-01-20 NOTE — Progress Notes (Signed)
Bag ventilation during CPR.

## 2018-01-20 NOTE — ED Provider Notes (Addendum)
[facility] Provider Note   CSN: [id number] Arrival date & time: 05-01-17  1306     History   Chief Complaint No chief complaint on file.   HPI Charles Rich is a 57 y.o. male.  57 year old male here with cardiac arrest.  Patient reportedly was complaining of chest pain shortness of breath per EMS report.  On their arrival, patient was clutching his chest and apparently short of breath and distress.  He then went apneic.  He was noted to initially be in sinus bradycardia then went into asystole.  He underwent at least 10 to 15 minutes of CPR compressions with multiple doses of epinephrine.  A King airway was placed then replaced by a 7 oh ET tube.  Patient had no spontaneous movement or respirations during resuscitation.  He reportedly was unresponsive and did not require additional medications.  He is undergoing active CPR on arrival.  Review of records, patient was just admitted for abscess and cellulitis.  He is on doxycycline.  History of MR.        Past Medical History:  Diagnosis Date  . Mental retardation   . Seizures [facility](HCC)     Patient Active Problem List   Diagnosis Date Noted  . Abscess of right leg 01/08/2018  . Hypernatremia 01/04/2018  . 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 No pertinent surgical history.      Home Medications    Prior to Admission medications   Medication Sig Start Date End Date Taking? Authorizing Provider  acetaminophen (TYLENOL) 325 MG tablet Take 2 tablets (650 mg total) by mouth every 6 (six) hours as needed for mild pain, fever or headache. 01/08/18   Zannie CoveJoseph, Preetha, MD  doxycycline (VIBRA-TABS) 100 MG tablet Take 1 tablet (100 mg total) by mouth 2 (two) times daily after a meal for 6 days. 01/08/18 01/14/18  Zannie CoveJoseph, Preetha, MD  levETIRAcetam (KEPPRA) 500 MG tablet Take one tablet in am and two tablets in pm. 12/01/17   Levert FeinsteinYan, Yijun, MD    Multiple Vitamin (MULTIVITAMIN) capsule Take 1 capsule by mouth daily.    [provider]  phenytoin (DILANTIN) 100 MG ER capsule Take 100 mg by mouth See admin instructions. 100 mg in the morning and 200 mg at night    [provider]    Family History Family History  Problem Relation Age of Onset  . Diabetes Mother     Social History Social History   Tobacco Use  . Smoking status: Never Smoker  . Smokeless tobacco: Never Used  Substance Use Topics  . Alcohol use: No  . Drug use: No     Allergies   Patient has no known allergies.   Review of Systems Review of Systems  Unable to perform ROS: Patient unresponsive     Physical Exam Updated Vital Signs BP (!) 0/0   Pulse (!) 0   Temp (!) 94.9 F (34.9 C) (Temporal)   Resp (!) 0   Ht 6' (1.829 m)   Wt 65.2 kg   SpO2 100%   BMI 19.49 kg/m   Physical Exam  Constitutional:  Unresponsive, intubated.  HENT:  No apparent head/neck trauma. 7-0 ETT in place.  Eyes:  5 mm, non-reactive b/l  Neck: Neck supple.  JVD noted. No tracheal deviation.  Cardiovascular:  No heart sounds appreciated.  Pulmonary/Chest: He is in respiratory distress.  No spontaneous respirations. BS symmetric bilaterally.  Abdominal: Soft. He exhibits  no distension.  Soft, non-distended. No pulsatile masses appreciated.  Musculoskeletal: He exhibits no edema.  Neurological:  GCS 3T. No spontaneous movement noted. No respirations.  Skin:  Cool     ED Treatments / Results  Labs (all labs ordered are listed, but only abnormal results are displayed) Labs Reviewed  CBC WITH DIFFERENTIAL/PLATELET - Abnormal; Notable for the following components:      Result Value   WBC 16.6 (*)    RBC 4.11 (*)    Hemoglobin 11.5 (*)    MCV 103.4 (*)    MCHC 27.1 (*)    All other components within normal limits  COMPREHENSIVE METABOLIC PANEL - Abnormal; Notable for the following components:   Sodium 151 (*)    CO2 15 (*)     Glucose, Bld 193 (*)    Creatinine, Ser 1.39 (*)    Calcium 8.1 (*)    Total Protein 5.2 (*)    Albumin 2.1 (*)    AST <5 (*)    ALT 299 (*)    GFR calc non Af Amer 55 (*)    Anion gap 27 (*)    All other components within normal limits  TROPONIN I - Abnormal; Notable for the following components:   Troponin I 0.06 (*)    All other components within normal limits  PHENYTOIN LEVEL, TOTAL - Abnormal; Notable for the following components:   Phenytoin Lvl <2.5 (*)    All other components within normal limits  CBC WITH DIFFERENTIAL/PLATELET - Abnormal; Notable for the following components:   WBC 16.3 (*)    RBC 4.09 (*)    Hemoglobin 11.7 (*)    MCV 102.9 (*)    MCHC 27.8 (*)    Neutro Abs 9.5 (*)    Lymphs Abs 6.0 (*)    All other components within normal limits  CBG MONITORING, ED - Abnormal; Notable for the following components:   Glucose-Capillary 145 (*)    All other components within normal limits  I-STAT CHEM 8, ED - Abnormal; Notable for the following components:   Sodium 149 (*)    Chloride 112 (*)    Glucose, Bld 176 (*)    Calcium, Ion 1.02 (*)    TCO2 20 (*)    Hemoglobin 11.9 (*)    HCT 35.0 (*)    All other components within normal limits  I-STAT CG4 LACTIC ACID, ED - Abnormal; Notable for the following components:   Lactic Acid, Venous >17.00 (*)    All other components within normal limits  I-STAT TROPONIN, ED - Abnormal; Notable for the following components:   Troponin i, poc 0.09 (*)    All other components within normal limits  I-STAT VENOUS BLOOD GAS, ED - Abnormal; Notable for the following components:   pH, Ven 6.944 (*)    pCO2, Ven 75.2 (*)    Bicarbonate 16.3 (*)    TCO2 19 (*)    Acid-base deficit 16.0 (*)    All other components within normal limits  I-STAT CG4 LACTIC ACID, ED    EKG EKG Interpretation  Date/Time:  Saturday 01-14-18 13:47:31 EDT Ventricular Rate:  38 PR Interval:    QRS Duration: 132 QT Interval:  376 QTC  Calculation: 299 R Axis:   168 Text Interpretation:  Sinus bradycardia Ventricular premature complex Prolonged PR interval Consider left atrial enlargement Nonspecific intraventricular conduction delay Posterior infarct, acute (LCx) Minimal ST elevation, anterolateral leads >>> Acute MI <<< Confirmed by Shaune Pollack [phone]) on  02-06-18 4:51:14 PM   Radiology Dg Chest Portable 1 View  Result Date: 2018/02/06 CLINICAL DATA:  CPR in progress, intubated EXAM: PORTABLE CHEST 1 VIEW COMPARISON:  None. FINDINGS: Portable rotated exam to the right. Endotracheal tube at the T2 level well above the carina approximately 8.6 cm. Defibrillator pads overlie the left chest. Normal heart size and vascularity. No significant collapse or consolidation. Negative for edema. No large effusion or pneumothorax. Left lung base not entirely included on the study. IMPRESSION: Endotracheal tube approximately 8.6 cm above the carina. Limited exam because of technique and positioning. Mild hyperinflation Electronically Signed   By: Judie Petit.  Shick M.D.   On: 2018/02/06 14:03    Procedures .Critical Care Performed by: Shaune Pollack, MD Authorized by: Shaune Pollack, MD   Critical care provider statement:    Critical care time (minutes):  75   Critical care time was exclusive of:  Separately billable procedures and treating other patients and teaching time   Critical care was necessary to treat or prevent imminent or life-threatening deterioration of the following conditions:  Circulatory failure, cardiac failure and respiratory failure   Critical care was time spent personally by me on the following activities:  Development of treatment plan with patient or surrogate, discussions with consultants, evaluation of patient's response to treatment, examination of patient, obtaining history from patient or surrogate, ordering and performing treatments and interventions, ordering and review of laboratory studies, ordering and  review of radiographic studies, pulse oximetry, re-evaluation of patient's condition and review of old charts   I assumed direction of critical care for this patient from another provider in my specialty: no     (including critical care time)  Cardiopulmonary Resuscitation (CPR) Procedure Note Directed/Performed by: Dollene Cleveland I personally directed ancillary staff and/or performed CPR in an effort to regain return of spontaneous circulation and to maintain cardiac, neuro and systemic perfusion.    Peripheral IV: Left EJ line placed by myself due to nursing difficulty obtaining line and labs. Prepped in sterile fashion. Good blood return.    EMERGENCY DEPARTMENT [place] CARDIAC EXAM "Study: Limited Ultrasound of the Heart and Pericardium"  INDICATIONS:Cardiac arrest Multiple views of the heart and pericardium were obtained in real-time with a multi-frequency probe.  PERFORMED [id number] IMAGES ARCHIVED?: Yes LIMITATIONS:  Emergent procedure VIEWS USED: Subcostal 4 chamber, Parasternal long axis, Parasternal short axis and Inferior Vena Cava INTERPRETATION: Cardiac activity absent, Pericardial effusioin absent, IVC dilated and No coordinated contractions   Ultrasound also performed bilateral superior lung fields with linear probe, shows normal lung slide.  Medications Ordered in ED Medications  EPINEPHrine (ADRENALIN) 4 mg in dextrose 5 % 250 mL (0.016 mg/mL) infusion (has no administration in time range)  EPINEPHrine (ADRENALIN) 1 MG/10ML injection (1 mg Intravenous Given 2018/02/06 1337)  sodium bicarbonate injection (50 mEq Intravenous Given 06-Feb-2018 1326)  calcium chloride injection (1 g Intravenous Given February 06, 2018 1330)  atropine injection (1 mg Intravenous Given February 06, 2018 1349)     Initial Impression / Assessment and Plan / ED Course  I have reviewed the triage vital signs and the nursing notes.  Pertinent labs & imaging results that were available during my care of the patient were  reviewed by me and considered in my medical decision making (see chart for details).     57 year old male with history of cognitive impairment here with cardiac arrest.  Initial loss of pulses with a systolic rhythm preceded by bradycardia.  Patient in PEA since then.  On arrival, we had brief return  of spontaneous circulation.  Patient was given multiple doses of epinephrine, bicarbonate, as well as calcium given mildly widened QRS.  Initial EKG obtained after ROSC concerning for lateral myocardial infarction.  Code STEMI activated.  However, patient subsequently lost pulses.  Patient became bradycardic then went into PEA.  Despite a brief, less than 10-second return of pulses, we were unable to obtain pulses or synchronized cardiac activity despite over 40 minutes of active resuscitation.  End-tidal CO2 was monitored and progressively declined to less than 10.  Heart rate was in the 20s to 30s.  I discussed with Dr. Tresa Endo who is at the bedside.  Given his only brief episodes of ROSC with lactate markedly elevated and profound acidosis, with no organized cardiac activity on ultrasound, patient is not a candidate for cardiac intervention or further resuscitation.  Given his profound lab work abnormalities with prolonged downtime and no evidence of significant neurological function on exam, decision made to terminate efforts.  I called patient's POA and discussed results.  Notified PCP.  Patient declined by medical examiner.  Final Clinical Impressions(s) / ED Diagnoses   Final diagnoses:  Cardiac arrest (HCC)  ST elevation myocardial infarction (STEMI), unspecified artery (HCC)  Acute respiratory failure, unspecified whether with hypoxia or hypercapnia [facility])    ED Discharge Orders    None       Shaune Pollack, MD 01/31/2018 1654    Shaune Pollack, MD January 31, 2018 1655    Shaune Pollack, MD 01/11/18 [phone]

## 2018-01-20 NOTE — ED Notes (Signed)
ROSC    1308

## 2018-01-20 NOTE — Progress Notes (Signed)
   19-May-2017 1400  Clinical Encounter Type  Visited With Patient  Visit Type Death  Was already in ED when Pt. was in CPR status. Patient did not survive. Contact his brother to see if he or a family can come to hospital. Brother asked about Patient condition however, I informed him to come to hospital. Brother said he will send his grandson to hospital. Per speaking with Doctor, he said he would call the brother and inform him of patient death. Chaplain provided ministry of presence.

## 2018-01-20 NOTE — ED Notes (Signed)
Dr. Erma HeritageIsaacs remains at bedside with ongoing code-minimal cardiac movement on U/S.

## 2018-01-20 NOTE — ED Triage Notes (Signed)
Patient arrived from home following severe SOB and then followed by cardiac arrest. EMS reports asystole initially. CPR Started and king airway and IO initiated pta. Patient received EPI x 5 and NS bolus 500 pta. CBG 230 per EMS. EMS secured 7.0 ETT just prior to arrival. Patient discharged from hospital on 9/19 following admit due to leg abscess.

## 2018-01-20 DEATH — deceased

## 2018-01-21 NOTE — Discharge Summary (Signed)
Physician Discharge Summary  [name] [facility] ZOX:[id number] DOB: 09-21-1960 DOA: 01/04/2018  PCP: Kallie Locks, FNP  Admit date: 01/04/2018 Discharge date: 01/08/2018  Time spent: 35 minutes  Recommendations for Outpatient Follow-up:  1. Home with home health services 2. Pt and family declined Rehab 3. PCP in 1 week   Discharge Diagnoses:  Active Problems:   Hypernatremia   Abscess of right leg  Mental retardation  Debility  H/o seizures   Dysphagia  Discharge Condition: stable  Diet recommendation: soft diet  Filed Weights   01/06/18 2000 01/07/18 0500 01/08/18 0900  Weight: 69.1 kg 69.1 kg 65.2 kg    History of present illness:  [name] with PMH of Mental retardation, seizure d/o was admitted with superficial abscess/cellulitis of R knee  Hospital Course:  1.  Dehydration causing Hypernatremia.  Hyperchloremia -Na improved, stopped IVF -repeat Na stable off IVF  2.  MRSA Right knee cellulitis, subcutaneous abscess. -orthopedics consulted,no evidence of septic arthritis infection and superficial abscess suspected -Underwent bedside aspiration, culture grew MRSA -Treated with IV vancomycin, clinically improving, transitioned to oral Doxycycline at discharge -PT  eval completed, SNF recommended, Pt and family decided to take him home with home health services  3.  History of seizures. -stable, continue home regimen of Dilantin and Keppra  4. Mental retardation. Management per outpatient regimen. -cared for by family at home  5.  Concern for dysphagia. Speech therapy consulted, patient will be on dysphagia 3 soft diet  6.  Mild hypokalemia. Replaced   Discharge Exam: Vitals:   01/08/18 0712 01/08/18 1100  BP: (!) 91/56 102/74  Pulse: 88 97  Resp: 17 (!) 28  Temp: 98.6 F (37 C) 98.5 F (36.9 C)  SpO2: 96% 95%    General: alert, awake, oreinted to self and place Cardiovascular: S1S2/RRR Respiratory: CTAB  Discharge  Instructions   Discharge Instructions    Diet - low sodium heart healthy   Complete by:  As directed    Face-to-face encounter (required for Medicare/Medicaid patients)   Complete by:  As directed    I Zannie Cove certify that this patient is under my care and that I, or a nurse practitioner or physician's assistant working with me, had a face-to-face encounter that meets the physician face-to-face encounter requirements with this patient on 01/08/2018. The encounter with the patient was in whole, or in part for the following medical condition(s) which is the primary reason for home health care (List medical condition):debility, mental retardation   The encounter with the patient was in whole, or in part, for the following medical condition, which is the primary reason for home health care:  debility, mental retardation   I certify that, based on my findings, the following services are medically necessary home health services:  Physical therapy   Reason for Medically Necessary Home Health Services:  Therapy- Therapeutic Exercises to Increase Strength and Endurance   My clinical findings support the need for the above services:  Unable to leave home safely without assistance and/or assistive device   Further, I certify that my clinical findings support that this patient is homebound due to:  Unable to leave home safely without assistance   Home Health   Complete by:  As directed    To provide the following care/treatments:   PT OT     Increase activity slowly   Complete by:  As directed      Allergies as of 01/08/2018   No Known Allergies     Medication  List    STOP taking these medications   gabapentin 300 MG capsule Commonly known as:  NEURONTIN     TAKE these medications   acetaminophen 325 MG tablet Commonly known as:  TYLENOL Take 2 tablets (650 mg total) by mouth every 6 (six) hours as needed for mild pain, fever or headache.   levETIRAcetam 500 MG tablet Commonly known  as:  KEPPRA Take one tablet in am and two tablets in pm.   multivitamin capsule Take 1 capsule by mouth daily.   phenytoin 100 MG ER capsule Commonly known as:  DILANTIN Take 100 mg by mouth See admin instructions. 100 mg in the morning and 200 mg at night     ASK your doctor about these medications   doxycycline 100 MG tablet Commonly known as:  VIBRA-TABS Take 1 tablet (100 mg total) by mouth 2 (two) times daily after a meal for 6 days. Ask about: Should I take this medication?      No Known Allergies Follow-up Information    [facility].   Specialty:  Home Health Services Why:   HHPT, HHOT,  Contact information: [address] [place] [address] [phone]            The results of significant diagnostics from this hospitalization (including imaging, microbiology, ancillary and laboratory) are listed below for reference.    Significant Diagnostic Studies: Ct Knee Right Wo Contrast  Result Date: 01/04/2018 CLINICAL DATA:  Right lower extremity infection as question infectious bursitis. EXAM: CT OF THE RIGHT KNEE WITHOUT CONTRAST TECHNIQUE: Multidetector CT imaging of the right knee was performed according to the standard protocol. Multiplanar CT image reconstructions were also generated. COMPARISON:  Plain films today. FINDINGS: Bones/Joint/Cartilage Examination demonstrates mild-to-moderate tricompartmental osteoarthritic change. Couple tiny intra-articular loose bodies. No acute fracture or dislocation. No joint effusion. Ligaments Suboptimally assessed by CT. Muscles and Tendons Unremarkable. Soft tissues Mild diffuse subcutaneous edema. There is moderate edema/soft tissue swelling over the anterior aspect of the infrapatellar soft tissues superficial to the patella tendon extending around to the lateral half of the proximal lower leg. This likely represents an infected fluid collection/abscess in this patient with known  cellulitis. Borders of this collection are difficult to identify and therefore difficult to measure although the anterior aspect of this fluid collection measures approximately 2.0 x 5.5 x 5.4 cm in AP, transverse and craniocaudal dimension. IMPRESSION: Subcutaneous fluid collection most prominent over the anterior infrapatellar region extending around the lateral half of the proximal lower leg. This likely represents an infected fluid collection/subcutaneous abscess in this patient with known cellulitis. Tricompartmental osteoarthritic change. Couple small intra-articular loose bodies. No joint effusion. Electronically Signed   By: Elberta Fortis M.D.   On: 01/04/2018 19:22   Dg Chest Portable 1 View  Result Date: 2018/02/03 CLINICAL DATA:  CPR in progress, intubated EXAM: PORTABLE CHEST 1 VIEW COMPARISON:  None. FINDINGS: Portable rotated exam to the right. Endotracheal tube at the T2 level well above the carina approximately 8.6 cm. Defibrillator pads overlie the left chest. Normal heart size and vascularity. No significant collapse or consolidation. Negative for edema. No large effusion or pneumothorax. Left lung base not entirely included on the study. IMPRESSION: Endotracheal tube approximately 8.6 cm above the carina. Limited exam because of technique and positioning. Mild hyperinflation Electronically Signed   By: Judie Petit.  Shick M.D.   On: Feb 03, 2018 14:03   Dg Knee Complete 4 Views Right  Result Date: 01/04/2018 CLINICAL DATA:  Knee infection EXAM: RIGHT KNEE - COMPLETE 4+ VIEW COMPARISON:  None. FINDINGS: Mild degenerative narrowing of the medial compartment. At least mild degenerative change at the patellofemoral compartment, with associated osseous spurring. Osseous alignment is otherwise normal. No acute or suspicious osseous lesion. No evidence of acute fracture. No destructive change to suggest osteomyelitis. Chronic calcification adjacent to the medial malleolus, likely indicating previous injury  of the MCL. Prominent soft tissue thickening overlying the patella and proximal tibia, best seen on lateral projection. No soft tissue gas seen. No appreciable joint effusion. IMPRESSION: 1. Prominent soft tissue thickening/swelling involving the superficial soft tissues overlying the patella and proximal tibia, best seen on the lateral view of the RIGHT knee. Cellulitis? No soft tissue gas seen. No evidence of underlying joint effusion. 2. No acute appearing osseous abnormality. 3. Mild degenerative changes of the medial and patellofemoral compartment, as detailed above. Electronically Signed   By: Bary Richard M.D.   On: 01/04/2018 16:47   [place] Rt Lower Extrem Ltd Soft Tissue Non Vascular  Result Date: 01/04/2018 CLINICAL DATA:  Elevated white blood cell count with leg swelling EXAM: ULTRASOUND RIGHT LOWER EXTREMITY LIMITED TECHNIQUE: Ultrasound examination of the lower extremity soft tissues was performed in the area of clinical concern. COMPARISON:  None. FINDINGS: Sonographic evaluation in the area of clinical concern shows a diffuse area of decreased echogenicity identified in the right lower extremity measuring 11.9 x 2.2 x 5.7 cm. This likely represents a developing abscess. No other focal abnormality is noted. IMPRESSION: Apparent developing subcutaneous abscess in the right lower leg. Electronically Signed   By: Alcide Clever M.D.   On: 01/04/2018 14:40    Microbiology: No results found for this or any previous visit (from the past 240 hour(s)).   Labs: Basic Metabolic Panel: No results for input(s): NA, K, CL, CO2, GLUCOSE, BUN, CREATININE, CALCIUM, MG, PHOS in the last 168 hours. Liver Function Tests: No results for input(s): AST, ALT, ALKPHOS, BILITOT, PROT, ALBUMIN in the last 168 hours. No results for input(s): LIPASE, AMYLASE in the last 168 hours. No results for input(s): AMMONIA in the last 168 hours. CBC: No results for input(s): WBC, NEUTROABS, HGB, HCT, MCV, PLT in the last 168  hours. Cardiac Enzymes: No results for input(s): CKTOTAL, CKMB, CKMBINDEX, TROPONINI in the last 168 hours. BNP: BNP (last 3 results) No results for input(s): BNP in the last 8760 hours.  ProBNP (last 3 results) No results for input(s): PROBNP in the last 8760 hours.  CBG: No results for input(s): GLUCAP in the last 168 hours.     Signed:  Zannie Cove MD.  [facility] 01/21/2018, 9:59 PM

## 2019-10-29 IMAGING — DX DG KNEE COMPLETE 4+V*R*
4 series · 4 of 4 positions shown · non-contrast
Comparison: None.

CLINICAL DATA: Knee infection

EXAM:
RIGHT KNEE - COMPLETE 4+ VIEW

[x knee lat right]
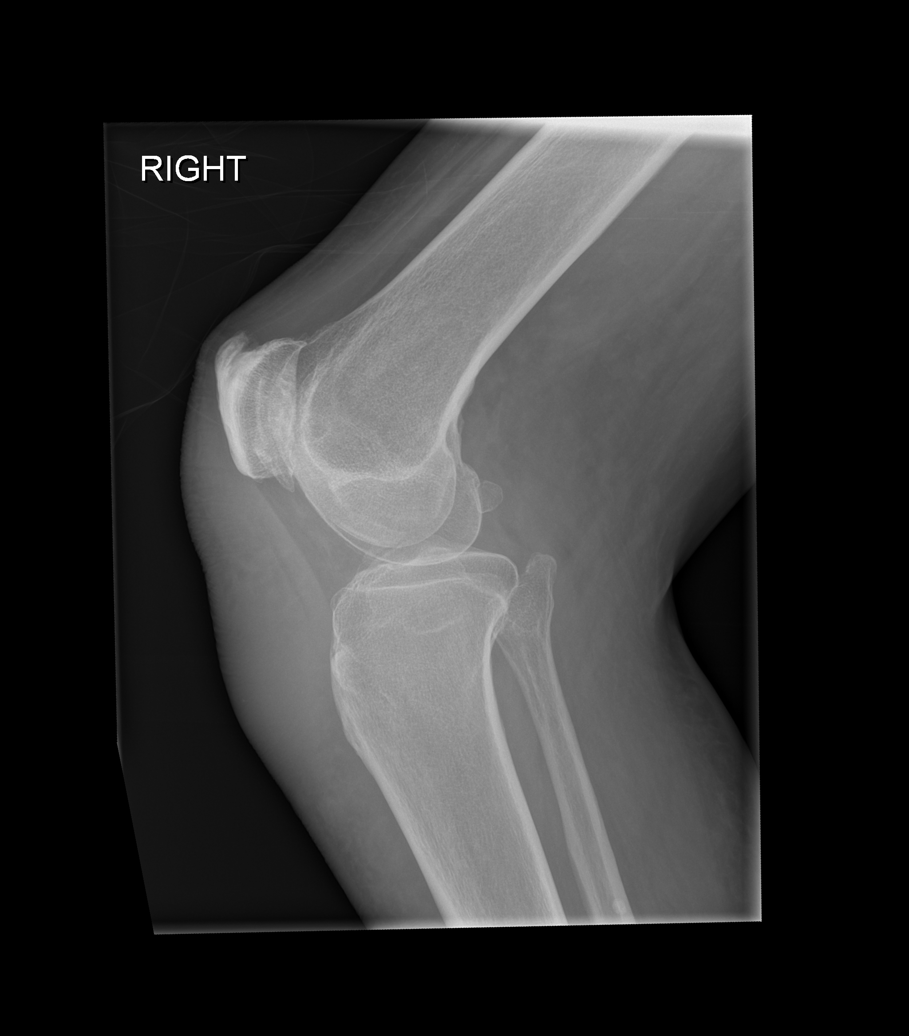

[x knee ap right (1 of 2)]
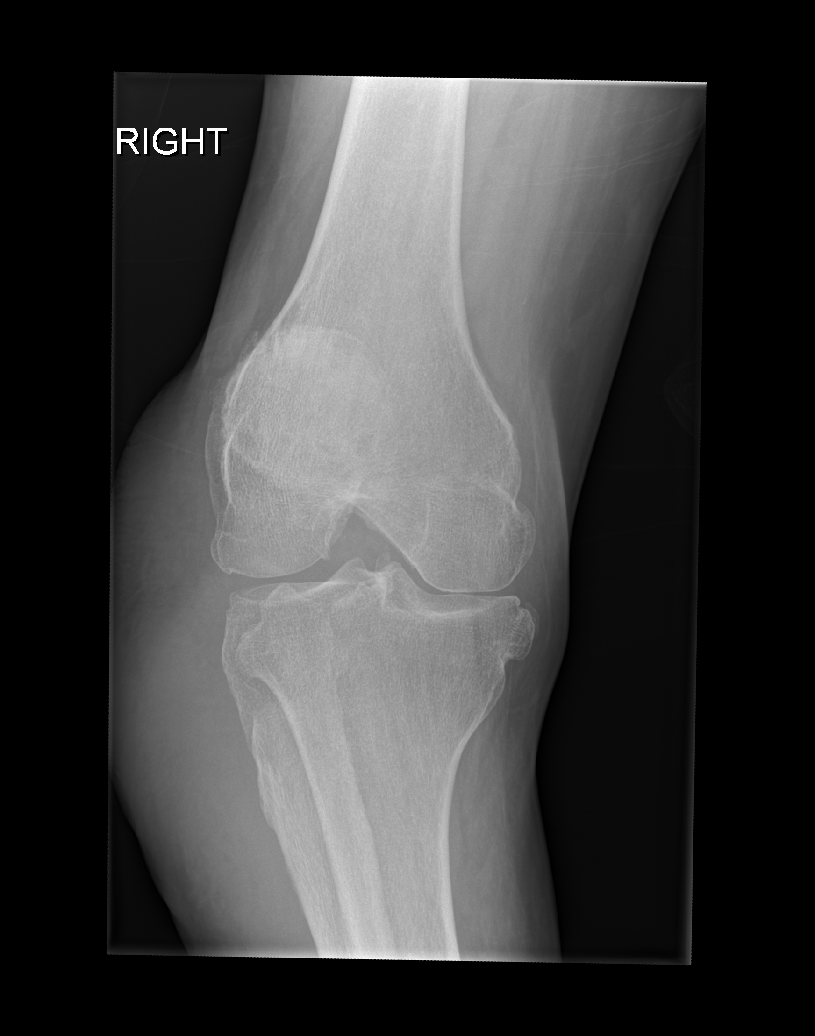

[x knee ap right (2 of 2)]
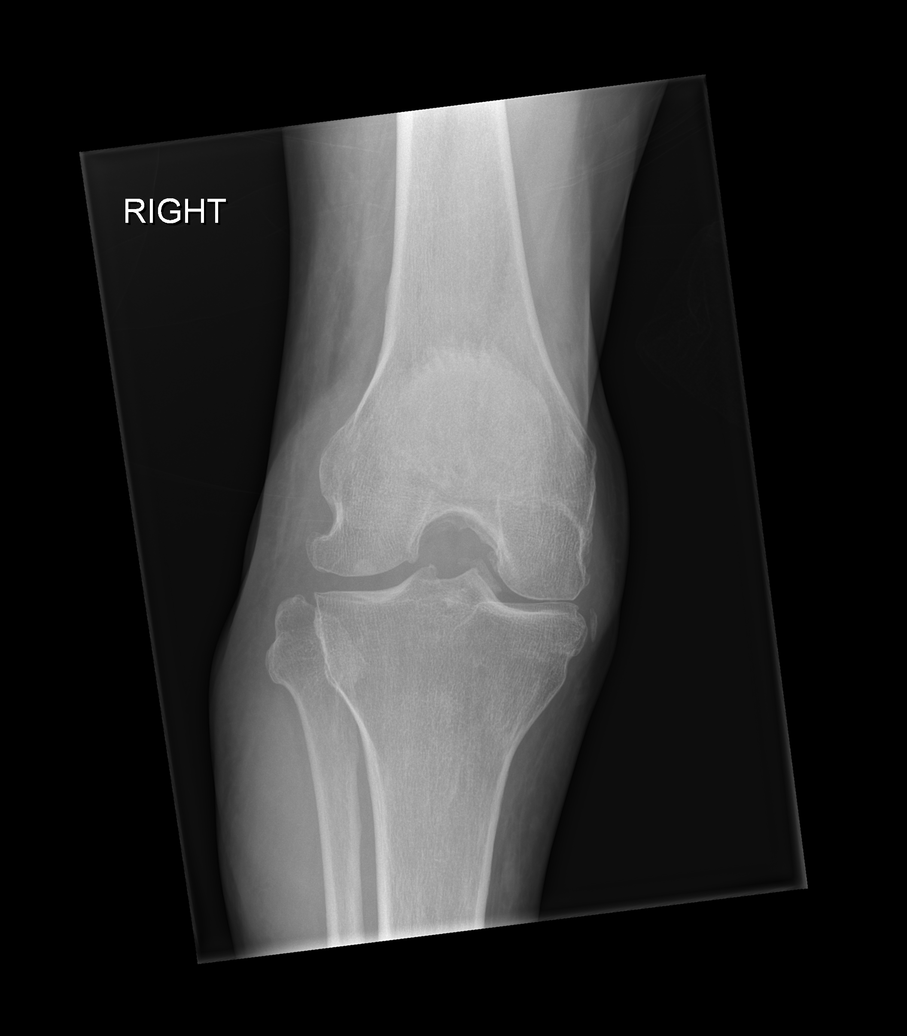

[x knee obl right]
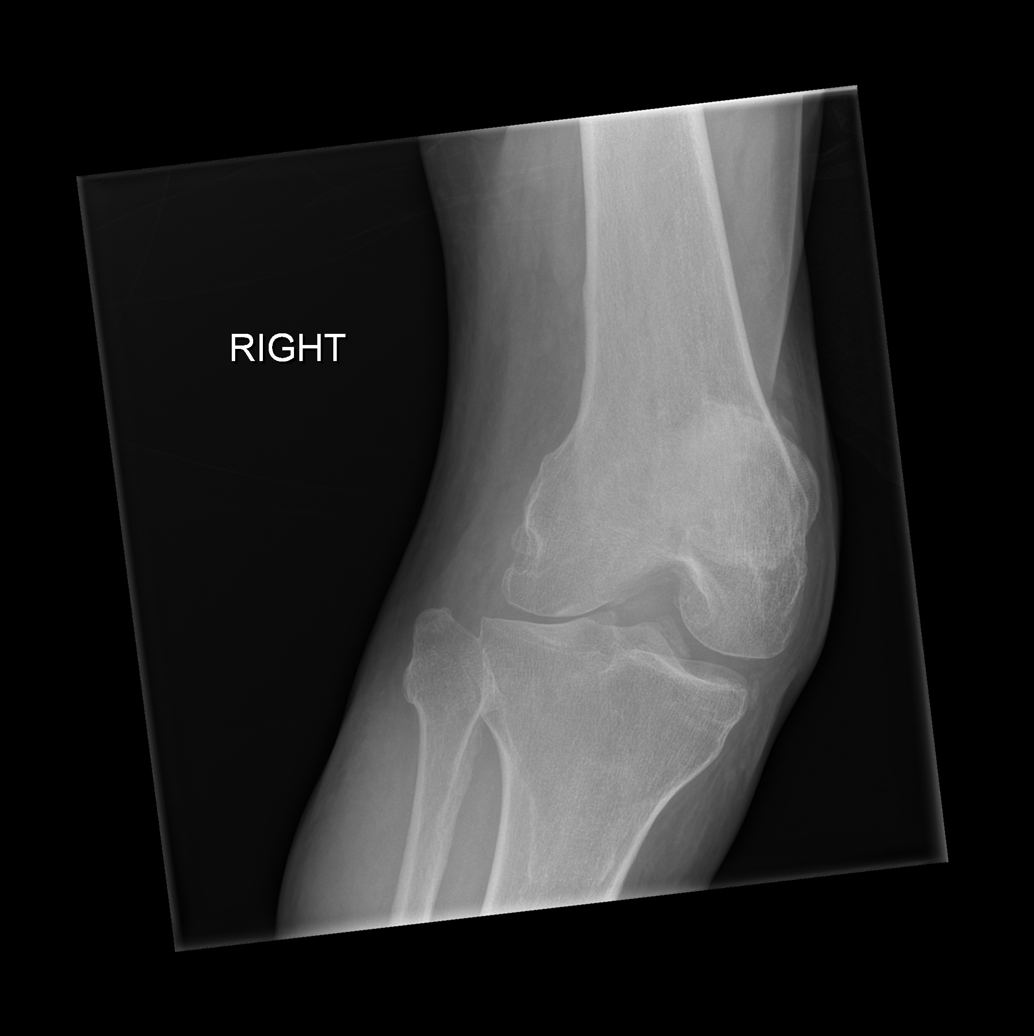

[4 of 4 positions shown; findings below may reference images not displayed]

FINDINGS: Mild degenerative narrowing of the medial compartment. At least mild
degenerative change at the patellofemoral compartment, with
associated osseous spurring. Osseous alignment is otherwise normal.

No acute or suspicious osseous lesion. No evidence of acute
fracture. No destructive change to suggest osteomyelitis. Chronic
calcification adjacent to the medial malleolus, likely indicating
previous injury of the MCL.

Prominent soft tissue thickening overlying the patella and proximal
tibia, best seen on lateral projection. No soft tissue gas seen. No
appreciable joint effusion.
IMPRESSION: 1. Prominent soft tissue thickening/swelling involving the
superficial soft tissues overlying the patella and proximal tibia,
best seen on the lateral view of the RIGHT knee. Cellulitis? No soft
tissue gas seen. No evidence of underlying joint effusion.
2. No acute appearing osseous abnormality.
3. Mild degenerative changes of the medial and patellofemoral
compartment, as detailed above.

## 2019-10-29 IMAGING — CT CT KNEE*R* W/O CM
3 series · 12 of 33 positions shown, 14 images · non-contrast
Comparison: Plain films today.

CLINICAL DATA: Right lower extremity infection as question
infectious bursitis.

EXAM:
CT OF THE RIGHT KNEE WITHOUT CONTRAST
TECHNIQUE: Multidetector CT imaging of the right knee was performed according
to the standard protocol. Multiplanar CT image reconstructions were
also generated.

[Series 6: lfov ext 3.0 b40s · axial · 0.42mm/px · z∈[+226,+416]mm · 4 of 91 slices shown, 5 images]
[im 14/91  soft-tissue]
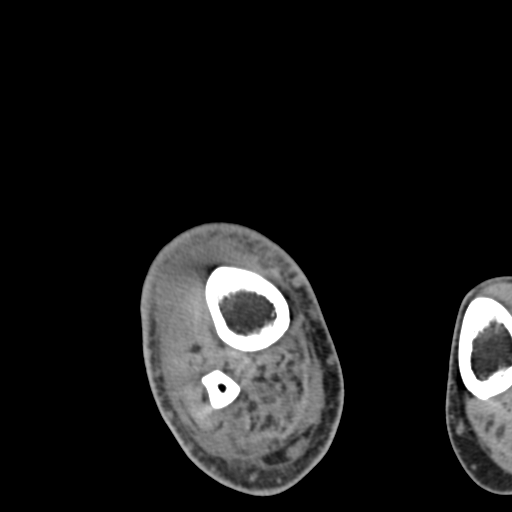
[im 14/91  bone]
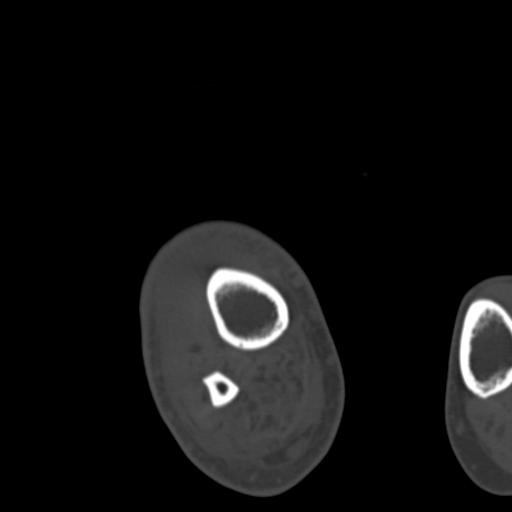
[im 35/91  bone]
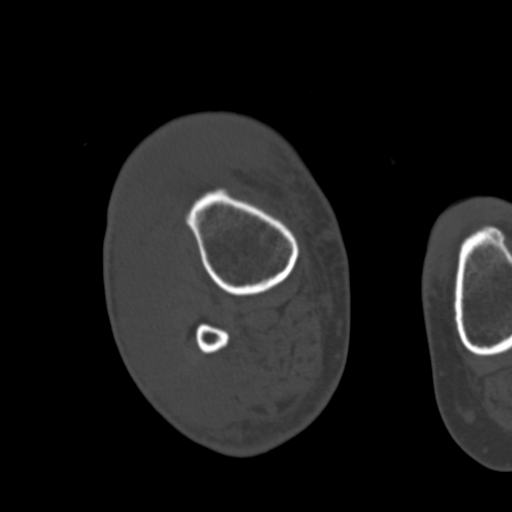
[im 56/91  bone]
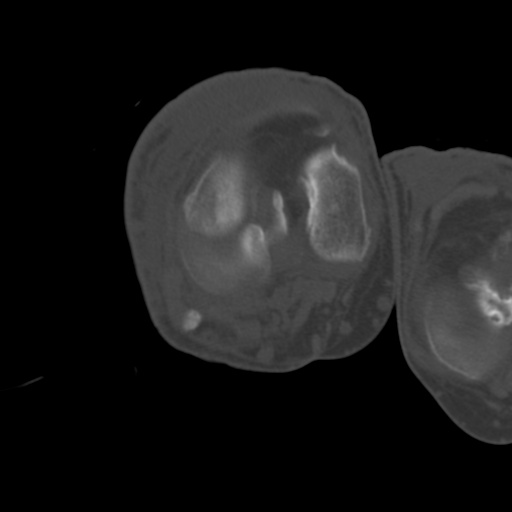
[im 77/91  bone]
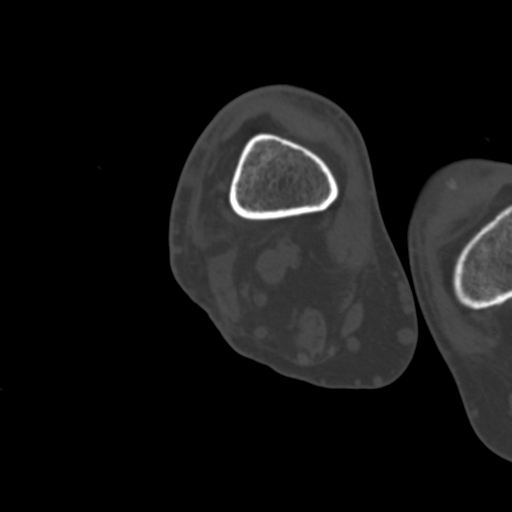

[Series 9: coronalsoft tissue · coronal · 0.34mm/px · 3 of 120 slices shown]
[im 24/120  bone]
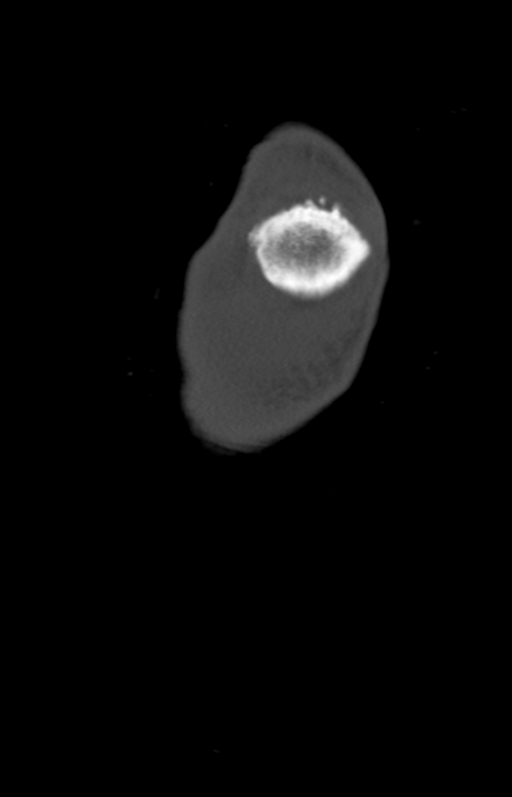
[im 48/120  bone]
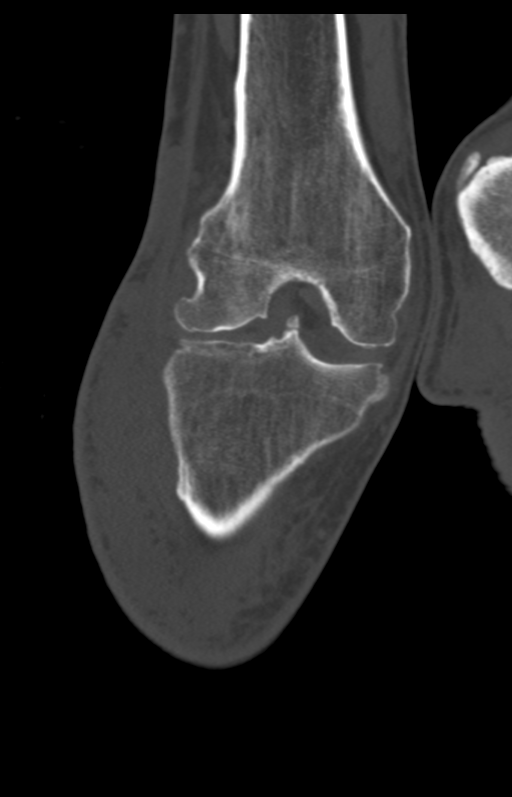
[im 72/120  bone]
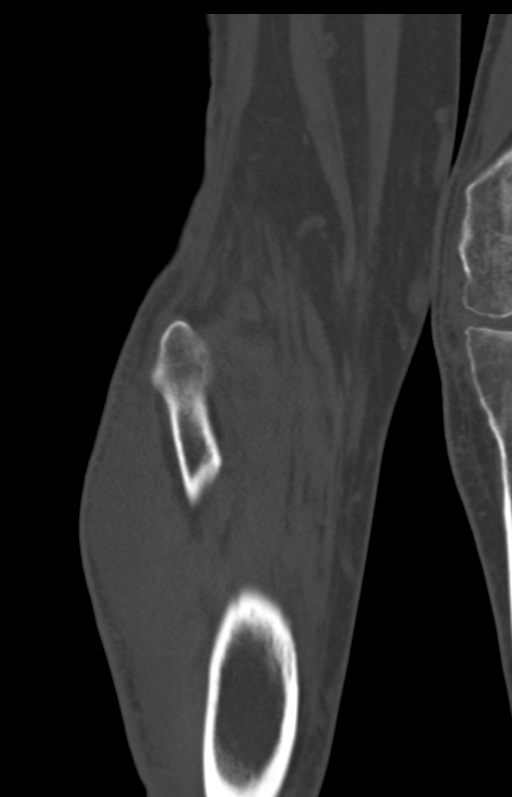

[Series 10: sagittalsoft tissue · sagittal · 0.39mm/px · 5 of 58 slices shown, 6 images]
[im 20/58  bone]
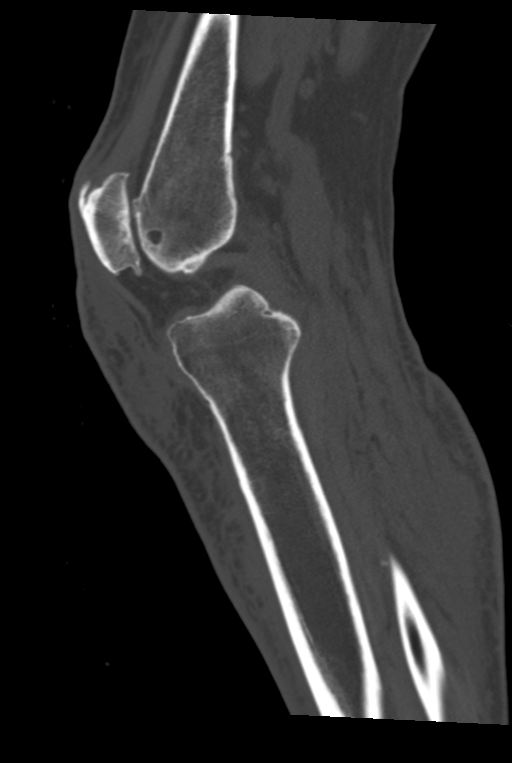
[im 24/58  bone]
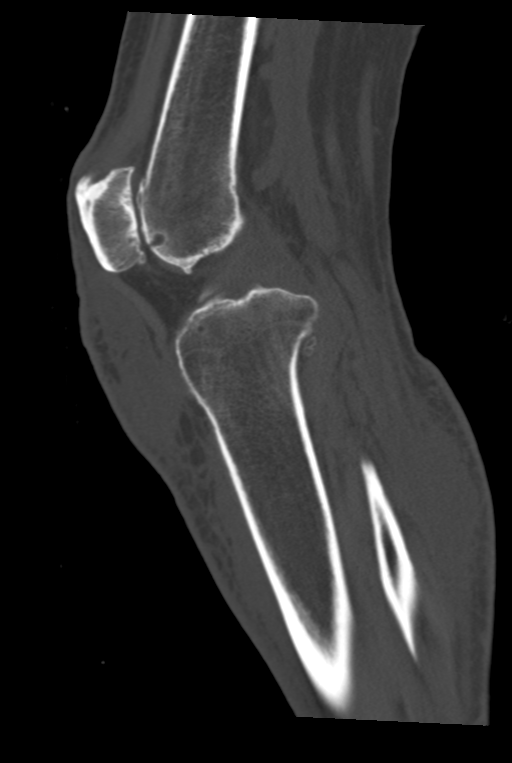
[im 29/58  soft-tissue]
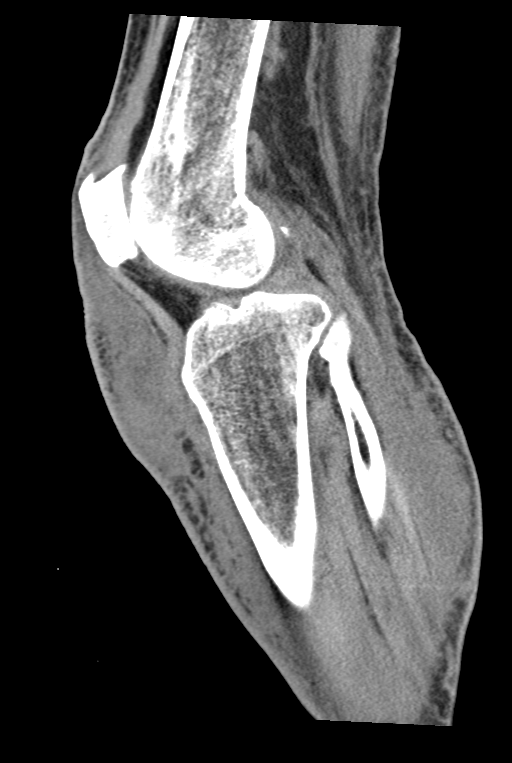
[im 29/58  bone]
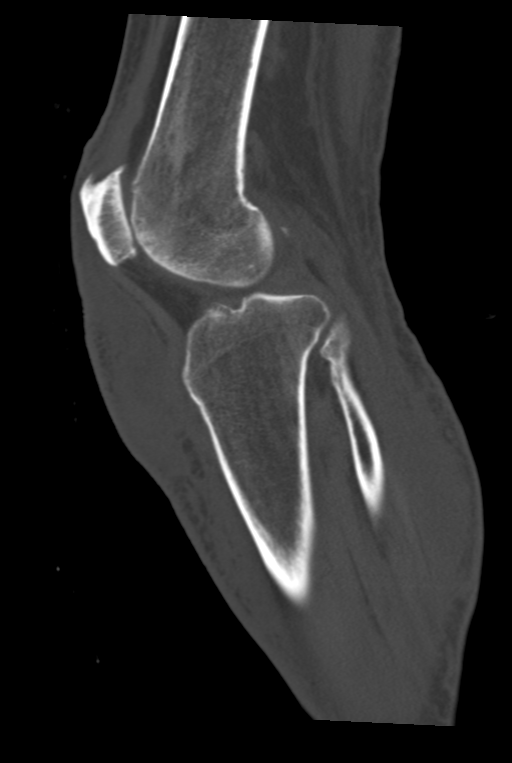
[im 34/58  bone]
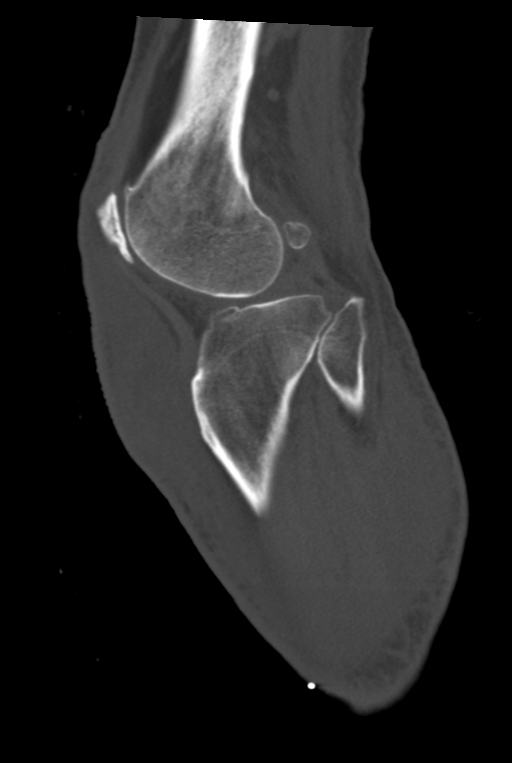
[im 39/58  bone]
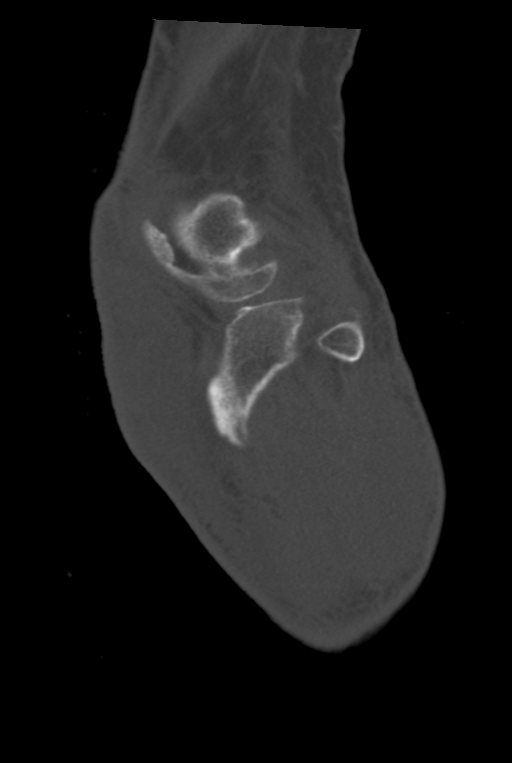

[12 of 33 positions shown; findings below may reference images not displayed]

FINDINGS: Bones/Joint/Cartilage

Examination demonstrates mild-to-moderate tricompartmental
osteoarthritic change. Couple tiny intra-articular loose bodies. No
acute fracture or dislocation. No joint effusion.

Ligaments

Suboptimally assessed by CT.

Muscles and Tendons

Unremarkable.

Soft tissues

Mild diffuse subcutaneous edema. There is moderate edema/soft tissue
swelling over the anterior aspect of the infrapatellar soft tissues
superficial to the patella tendon extending around to the lateral
half of the proximal lower leg. This likely represents an infected
fluid collection/abscess in this patient with known cellulitis.
Borders of this collection are difficult to identify and therefore
difficult to measure although the anterior aspect of this fluid
collection measures approximately 2.0 x 5.5 x 5.4 cm in AP,
transverse and craniocaudal dimension.
IMPRESSION: Subcutaneous fluid collection most prominent over the anterior
infrapatellar region extending around the lateral half of the
proximal lower leg. This likely represents an infected fluid
collection/subcutaneous abscess in this patient with known
cellulitis.

Tricompartmental osteoarthritic change. Couple small intra-articular
loose bodies. No joint effusion.
